# Patient Record
Sex: Female | Born: 1985 | Race: Black or African American | Hispanic: No | Marital: Married | Smoking: Never smoker
Health system: Southern US, Community
[De-identification: ages and names within clinical notes are randomized; demographics above are authoritative.]

## PROBLEM LIST (undated history)

## (undated) ENCOUNTER — Inpatient Hospital Stay (HOSPITAL_COMMUNITY): Payer: Self-pay

## (undated) DIAGNOSIS — J45909 Unspecified asthma, uncomplicated: Secondary | ICD-10-CM

## (undated) HISTORY — PX: WISDOM TOOTH EXTRACTION: SHX21

---

## 2014-10-12 ENCOUNTER — Emergency Department (HOSPITAL_COMMUNITY)
Admission: EM | Admit: 2014-10-12 | Discharge: 2014-10-12 | Disposition: A | Discharge status: Home / Self Care | Attending: Emergency Medicine | Admitting: Emergency Medicine

## 2014-10-12 ENCOUNTER — Encounter (HOSPITAL_COMMUNITY): Payer: Self-pay | Admitting: *Deleted

## 2014-10-12 DIAGNOSIS — J45909 Unspecified asthma, uncomplicated: Secondary | ICD-10-CM | POA: Insufficient documentation

## 2014-10-12 DIAGNOSIS — M5441 Lumbago with sciatica, right side: Secondary | ICD-10-CM | POA: Diagnosis not present

## 2014-10-12 DIAGNOSIS — M5431 Sciatica, right side: Secondary | ICD-10-CM

## 2014-10-12 DIAGNOSIS — M545 Low back pain: Secondary | ICD-10-CM | POA: Diagnosis present

## 2014-10-12 HISTORY — DX: Unspecified asthma, uncomplicated: J45.909

## 2014-10-12 MED ORDER — METHOCARBAMOL 500 MG PO TABS: 500.0000 mg | ORAL_TABLET | Freq: Two times a day (BID) | ORAL | Status: DC

## 2014-10-12 MED ORDER — NAPROXEN 500 MG PO TABS: 500.0000 mg | ORAL_TABLET | Freq: Two times a day (BID) | ORAL | Status: DC
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
## 2014-10-12 NOTE — ED Notes (Signed)
Pt states R hip pain that radiates down R leg.  Also c/o some R lower back pain.  Worse with movement.

## 2014-10-12 NOTE — Discharge Instructions (Signed)
1. Medications: robaxin, naproxyn, usual home medications °2. Treatment: rest, drink plenty of fluids, gentle stretching as discussed, alternate ice and heat °3. Follow Up: Please followup with your primary doctor in 3 days for discussion of your diagnoses and further evaluation after today's visit; if you do not have a primary care doctor use the resource guide provided to find one;  Return to the ER for worsening back pain, difficulty walking, loss of bowel or bladder control or other concerning symptoms ° ° °Back Exercises °Back exercises help treat and prevent back injuries. The goal of back exercises is to increase the strength of your abdominal and back muscles and the flexibility of your back. These exercises should be started when you no longer have back pain. Back exercises include: °· Pelvic Tilt. Lie on your back with your knees bent. Tilt your pelvis until the lower part of your back is against the floor. Hold this position 5 to 10 sec and repeat 5 to 10 times. °· Knee to Chest. Pull first 1 knee up against your chest and hold for 20 to 30 seconds, repeat this with the other knee, and then both knees. This may be done with the other leg straight or bent, whichever feels better. °· Sit-Ups or Curl-Ups. Bend your knees 90 degrees. Start with tilting your pelvis, and do a partial, slow sit-up, lifting your trunk only 30 to 45 degrees off the floor. Take at least 2 to 3 seconds for each sit-up. Do not do sit-ups with your knees out straight. If partial sit-ups are difficult, simply do the above but with only tightening your abdominal muscles and holding it as directed. °· Hip-Lift. Lie on your back with your knees flexed 90 degrees. Push down with your feet and shoulders as you raise your hips a couple inches off the floor; hold for 10 seconds, repeat 5 to 10 times. °· Back arches. Lie on your stomach, propping yourself up on bent elbows. Slowly press on your hands, causing an arch in your low back. Repeat 3  to 5 times. Any initial stiffness and discomfort should lessen with repetition over time. °· Shoulder-Lifts. Lie face down with arms beside your body. Keep hips and torso pressed to floor as you slowly lift your head and shoulders off the floor. °Do not overdo your exercises, especially in the beginning. Exercises may cause you some mild back discomfort which lasts for a few minutes; however, if the pain is more severe, or lasts for more than 15 minutes, do not continue exercises until you see your caregiver. Improvement with exercise therapy for back problems is slow.  °See your caregivers for assistance with developing a proper back exercise program. °Document Released: 03/03/2004 Document Revised: 04/18/2011 Document Reviewed: 11/25/2010 °ExitCare® Patient Information ©2015 [employer]. This information is not intended to replace advice given to you by your health care provider. Make sure you discuss any questions you have with your health care provider. ° ° ° °Emergency Department Resource Guide °1) Find a Doctor and Pay Out of Pocket °Although you won't have to find out who is covered by your insurance plan, it is a good idea to ask around and get recommendations. You will then need to call the office and see if the doctor you have chosen will accept you as a new patient and what types of options they offer for patients who are self-pay. Some doctors offer discounts or will set up payment plans for their patients who do not have insurance, but   you will need to ask so you aren't surprised when you get to your appointment. ° °2) Contact Your Local Health Department °Not all health departments have doctors that can see patients for sick visits, but many do, so it is worth a call to see if yours does. If you don't know where your local health department is, you can check in your phone book. The CDC also has a tool to help you locate your state's health department, and many state websites also have listings of all  of their local health departments. ° °3) Find a Walk-in Clinic °If your illness is not likely to be very severe or complicated, you may want to try a walk in clinic. These are popping up all over the country in pharmacies, drugstores, and shopping centers. They're usually staffed by nurse practitioners or physician assistants that have been trained to treat common illnesses and complaints. They're usually fairly quick and inexpensive. However, if you have serious medical issues or chronic medical problems, these are probably not your best option. ° °No Primary Care Doctor: °- Call [facility] at  832-8000 - they can help you locate a primary care doctor that  accepts your insurance, provides certain services, etc. °- Physician Referral Service- 1-800-533-3463 ° °Chronic Pain Problems: °Organization         Address  Phone   Notes  °[facility]  (336) 297-2271 Patients need to be referred by their primary care doctor.  ° °Medication Assistance: °Organization         Address  Phone   Notes  °[place] [facility] [address] °[place], [place] [address] (336) 641-8030 --Must be a resident of [place] °-- Must have NO insurance coverage whatsoever (no Medicaid/ Medicare, etc.) °-- The pt. MUST have a primary care doctor that directs their care regularly and follows them in the community °  °[facility]  (866) 331-1348   °[employer]  (888) 892-1162   ° °Agencies that provide inexpensive medical care: °Organization         Address  Phone   Notes  °[facility]  (336) 832-8035   °[facility]    (336) 832-7272   °[facility] [address] °[place], [place] [address] (336) 832-4777   °[facility] [address], °[place] (336) 271-4999   °[facility]    (336) 373-0678   °[facility]    (336) 272-1050   °[facility] ° [address],  [place] Phone:  (336) 832-4444, Fax:  (336) 832-4440 Hours of Operation:  9 am - 6 pm, M-F.  Also accepts Medicaid/Medicare and self-pay.  °[facility] ° [address], [place] Phone: (336) 832-3150, Fax: (336) [phone]. Hours of Operation:  8:30 am - 5:30 pm, M-F.  Also accepts Medicaid and self-pay.  °[facility] [place] [address], [place] Phone: (336) 878-6027   °[facility] [address], [place],  (336)723-1848, Ext. 123 Mondays & Thursdays: 7-9 AM.  First 15 patients are seen on a first come, first serve basis. °  ° °Medicaid-accepting [place] Providers: ° °Organization         Address  Phone   Notes  °[facility] [address],  (336) 641-2100 Also accepts self-pay patients.  °[facility] [address], [place] ° (336) 856-9996   °[facility] [address], [place] (336) 288-8857   °[facility] [address] [place] [address], [place] (336) 299-7000   °Veita Bland [address], [place]  ° (336) 373-1557 Only accepts [place] Medicaid patients after they have their name applied to their card.  ° °Self-Pay (no insurance) in [place]: ° °Organization         Address  Phone   Notes  °Sickle Cell Patients, [facility] [address], [place] (336) 832-1970   °[facility] [address], [place] (336) 832-4400   °[facility] ° [address], [place] (336) 992-4800   °[facility]/Dr. Osei-Bonsu ° [address] [place] [address], [place] or [address], [place] (336) [phone] Phone number for both [place] and [place] locations is the same.  °[facility] [address], [place] (336) 299-0000   °[facility] [address] [place] [address], [place] or [address] (336) 852-7530 °(336) 878-2260   °[facility] [address], [place] (336) 350-1642, phone; (336) 294-5005, fax Sees patients 1st and 3rd Saturday of every month.  Must not qualify for public or private insurance (i.e. Medicaid, Medicare, [place] Health Choice, Veterans' Benefits) • Household income should be no more than 200% of the poverty level •The clinic cannot treat you if you are pregnant or think you are pregnant • Sexually transmitted diseases are not treated at the clinic.  ° ° °Dental Care: °Organization         Address  Phone  Notes  °[facility] [address], [place] (336) 641-6152 Accepts children up to age 21 who are enrolled in Medicaid or [place] Health Choice; pregnant women with a Medicaid card; and children who have applied for Medicaid or [place] Health Choice, but were declined, whose parents can pay a reduced fee at time of service.  °[facility] [place]  [address], [place] (336) 641-7733 Accepts children up to age 21 who are enrolled in Medicaid or [place] Health Choice; pregnant women with a Medicaid card; and children who have applied for Medicaid or [place] Health Choice, but were declined, whose parents can pay a reduced fee at time of service.  °[facility] ° [address],  (336) 641-4533 Patients are seen by appointment only. Walk-ins are not accepted. [facility] will see patients 18 years of age and older. °Monday - Tuesday (8am-5pm) °Most Wednesdays (8:30-5pm) °$30 per visit, cash only  °[facility] ° [address], [place] (336) 641-4533 Patients are seen by appointment only. Walk-ins are not accepted. [facility] will see patients 18 years of age and older. °One Wednesday Evening (Monthly: Volunteer Based).  $30 per visit, cash only  °[facility]   (919) 537-3737 for adults; Children under age 4, call Graduate Pediatric Dentistry at ([phone]) [phone]. Children aged 4-14, please call (919) [phone] to request a pediatric application. ° Dental services are provided in all areas of dental care including fillings, crowns and bridges, complete and partial dentures, implants, gum treatment, root canals, and extractions. Preventive care is also provided. Treatment is provided to both adults and children. °Patients are selected via a lottery   and there is often a waiting list. °  °[facility] [address], °[place] ° (336) 763-8833 [email or web address] °  °[facility] [address], [place], [place] (336)723-1848, Ext. 123 Second and Fourth Thursday of each month, opens at 6:30 AM; Clinic ends at 9 AM.  Patients are seen on a first-come first-served basis, and a limited number are seen during each clinic.  ° °[facility] ° [address], [place], [place] (336) 723-7904   Eligibility Requirements °You must have lived in [place], [place], or [place] counties for at least the last three months. °  You cannot be eligible for state or federal sponsored healthcare insurance, including Veterans Administration, Medicaid, or Medicare. °  You generally cannot be eligible for healthcare insurance through your employer.  °  How to apply: °Eligibility screenings are held every Tuesday and Wednesday afternoon from 1:00 pm until 4:00 pm. You do not need an appointment for the interview!  °[facility] [address], [place], [place] 336-631-2330   °[facility]  336-342-8273   °[facility]  336-703-3100   °[facility]  336-570-6415   ° °Behavioral Health Resources in the Community: °Intensive Outpatient Programs °Organization         Address  Phone  Notes  °[facility] [address], [place], [place] 336-878-6098   °[facility] [address], [place], [place] 336-832-9800   °[facility] [address], [place], [place] ° 336-882-2125   °[facility] [address],  °[place], [place] 1-800-853-5163 or 336-641-4981   °Substance Abuse Resources °Organization         Address  Phone  Notes  °[facility]  336-882-2125   °[facility]  336-784-9470   °[facility]  336-285-9073   °[facility]  336-845-3988   °Residential & Outpatient Substance Abuse Program  1-800-659-3381   °Psychological Services °Organization         Address  Phone  Notes  ° Health  336- 832-9600   °[facility]  336- 378-7881   °[facility] [address], [place] 1-800-853-5163 or 336-641-4981   ° °Mobile Crisis Teams °Organization         Address  Phone  Notes  °[facility], Mobile Crisis Care Unit  1-877-626-1772   °Assertive °[facility] ° [address] [place], [place] 336-834-9664   °Sharon DeEsch [address] °[place] 336-554-5454   ° °Self-Help/Support Groups °Organization         Address  Phone             Notes  °[facility] - variety of support groups  336- 373-1402 Call for more information  °Narcotics Anonymous (NA), [facility] [address], °[place]  2 meetings at this location  ° °Residential Treatment Programs °Organization         Address  Phone  Notes  °[facility] [address],    °[place]  1-866-801-8205   °[facility] ° [address], [place], [place] 704-293-8524   °[facility] [address], [place] 336-845-3988 Admissions: 8am-3pm M-F  °[facility] [address]    °[place], [place] 336-841-1104   °[facility] [address], [place],    336-379-7146   °[facility] [address]  °[place], [place] 336-285-9073     °[facility] Intensive Outpatient [address], [place], [place] 336-852-3033   °[facility] [address]  °[place], [place] 1-877-615-2722 or 336-784-9470   °[facility] [address] [place], [place] 336-227-7417 Accepts Medicaid  °[facility] [address]  °[place] 1-800-659-3381 Substance Abuse/Addiction Treatment  ° °[place] Behavioral Health Resources °Organization         Address  Phone  Notes  °[facility]  (888) 581-9988   °Julie Brannon, PhD [address] [place], [place]   (336) 349-5553 or (336) 951-0000   °[facility]   [address] °[place], [place] (336) 349-4454   °[facility] [address], [place], [place] (336) 342-8316 Insurance/Medicaid/sponsorship through [facility]  °[facility] [address]                                    [place], [place] (336) 342-8316 Therapy/tele-psych/case  °[facility] [address]  ° [place], [place] (336) 349-2233    °Dr. Arfeen  (336) 349-4544   °[facility] [place]  [employer] [place] [facility] 1) [address], [place] °2) [address], [place] °3)  [address], [place] (336) 349-3220 °(336) 342-7768 ° °(336) 342-8140   °[place] [facility] (336) 342-1394 or (336) 342-3537 (After Hours)    ° ° ° ° ° °

## 2014-10-12 NOTE — ED Notes (Signed)
Declined W/C at D/C and was escorted to lobby by RN.Declined W/C at D/C and was escorted to lobby by RN. 

## 2014-10-16 ENCOUNTER — Encounter: Payer: Self-pay | Admitting: Emergency Medicine

## 2014-10-16 ENCOUNTER — Emergency Department
Admission: EM | Admit: 2014-10-16 | Discharge: 2014-10-16 | Discharge status: Against Medical Advice | Attending: Emergency Medicine | Admitting: Emergency Medicine

## 2014-10-16 DIAGNOSIS — R102 Pelvic and perineal pain: Secondary | ICD-10-CM | POA: Diagnosis not present

## 2014-10-16 NOTE — ED Notes (Signed)
Patient to ED with c/o bilateral pelvic pain for the last 2-3 days. Reports being very bloated feeling.

## 2014-11-06 ENCOUNTER — Encounter: Payer: Self-pay | Admitting: Obstetrics and Gynecology

## 2014-11-10 ENCOUNTER — Emergency Department (HOSPITAL_COMMUNITY)
Admission: EM | Admit: 2014-11-10 | Discharge: 2014-11-10 | Discharge status: Against Medical Advice | Source: Home / Self Care

## 2014-11-10 ENCOUNTER — Inpatient Hospital Stay (HOSPITAL_COMMUNITY)
Admission: AD | Admit: 2014-11-10 | Discharge: 2014-11-11 | Disposition: A | Discharge status: Home / Self Care | Source: Ambulatory Visit | Attending: Family Medicine | Admitting: Family Medicine

## 2014-11-10 ENCOUNTER — Encounter (HOSPITAL_COMMUNITY): Payer: Self-pay | Admitting: *Deleted

## 2014-11-10 DIAGNOSIS — J45909 Unspecified asthma, uncomplicated: Secondary | ICD-10-CM | POA: Diagnosis not present

## 2014-11-10 DIAGNOSIS — Z3A01 Less than 8 weeks gestation of pregnancy: Secondary | ICD-10-CM | POA: Diagnosis not present

## 2014-11-10 DIAGNOSIS — R102 Pelvic and perineal pain: Secondary | ICD-10-CM | POA: Insufficient documentation

## 2014-11-10 DIAGNOSIS — O26891 Other specified pregnancy related conditions, first trimester: Secondary | ICD-10-CM | POA: Diagnosis present

## 2014-11-10 LAB — CBC
HEMATOCRIT: 35.6 % — AB (ref 36.0–46.0)
HEMOGLOBIN: 12.2 g/dL (ref 12.0–15.0)
MCH: 29.5 pg (ref 26.0–34.0)
MCHC: 34.3 g/dL (ref 30.0–36.0)
MCV: 86 fL (ref 78.0–100.0)
Platelets: 242 10*3/uL (ref 150–400)
RBC: 4.14 MIL/uL (ref 3.87–5.11)
RDW: 14.5 % (ref 11.5–15.5)
WBC: 10.9 10*3/uL — ABNORMAL HIGH (ref 4.0–10.5)

## 2014-11-10 LAB — URINALYSIS, ROUTINE W REFLEX MICROSCOPIC
BILIRUBIN URINE: NEGATIVE
Glucose, UA: NEGATIVE mg/dL
HGB URINE DIPSTICK: NEGATIVE
KETONES UR: NEGATIVE mg/dL
Leukocytes, UA: NEGATIVE
NITRITE: NEGATIVE
Protein, ur: NEGATIVE mg/dL
SPECIFIC GRAVITY, URINE: 1.01 (ref 1.005–1.030)
UROBILINOGEN UA: 0.2 mg/dL (ref 0.0–1.0)
pH: 7 (ref 5.0–8.0)

## 2014-11-10 LAB — POCT PREGNANCY, URINE: PREG TEST UR: POSITIVE — AB

## 2014-11-10 LAB — HCG, QUANTITATIVE, PREGNANCY: HCG, BETA CHAIN, QUANT, S: 140446 m[IU]/mL — AB (ref <5)

## 2014-11-10 NOTE — MAU Note (Signed)
Pt reports really bad lower abd cramping x 2 days, lower abd pressure today. Denies bleeding.

## 2014-11-10 NOTE — ED Notes (Signed)
Pt stated that she didn't want to wait and that she was going to leave.

## 2014-11-10 NOTE — MAU Provider Note (Signed)
History     CSN: [id number]  Arrival date and time: 11/10/14 0865   First Provider Initiated Contact with Patient 11/10/14 2344      No chief complaint on file.  HPI Comments: Veronica Wilkerson is a 29 y.o. G3P0 at Unknown who presents today with abdominal pain and pelvic pressure. She states that her LMP was 09/19/14.   Pelvic Pain The patient's primary symptoms include pelvic pain. This is a new problem. The current episode started yesterday. The problem occurs constantly. The problem has been unchanged. Pain severity now: 5/10  The problem affects both sides. She is pregnant. Associated symptoms include abdominal pain and nausea. Pertinent negatives include no constipation, diarrhea, dysuria, fever, frequency, urgency or vomiting. The vaginal discharge was normal. There has been no bleeding. Nothing aggravates the symptoms. She has tried nothing for the symptoms.    Past Medical History  Diagnosis Date  . Asthma     Past Surgical History  Procedure Laterality Date  . Cesarean section      History reviewed. No pertinent family history.  Social History  Substance Use Topics  . Smoking status: Never Smoker   . Smokeless tobacco: None  . Alcohol Use: Yes     Comment: occ    Allergies: No Known Allergies  Prescriptions prior to admission  Medication Sig Dispense Refill Last Dose  . albuterol (ACCUNEB) 0.63 MG/3ML nebulizer solution Take 1 ampule by nebulization every 6 (six) hours as needed for wheezing.   Past Month at Unknown time  . methocarbamol (ROBAXIN) 500 MG tablet Take 1 tablet (500 mg total) by mouth 2 (two) times daily. 20 tablet 0   . montelukast (SINGULAIR) 10 MG tablet Take 10 mg by mouth at bedtime.   More than a month at Unknown time  . naproxen (NAPROSYN) 500 MG tablet Take 1 tablet (500 mg total) by mouth 2 (two) times daily with a meal. 30 tablet 0     Review of Systems  Constitutional: Negative for fever.  Gastrointestinal: Positive for nausea and  abdominal pain. Negative for vomiting, diarrhea and constipation.  Genitourinary: Positive for pelvic pain. Negative for dysuria, urgency and frequency.   Physical Exam   Blood pressure 117/60, pulse 68, temperature 98.9 F (37.2 C), temperature source Oral, resp. rate 18, height  (1.676 m), weight 108.863 kg (240 lb), last menstrual period 09/19/2014, SpO2 99 %.  Physical Exam  Nursing note and vitals reviewed. Constitutional: She is oriented to person, place, and time. She appears well-developed and well-nourished. No distress.  HENT:  Head: Normocephalic.  Eyes: EOM are normal.  Cardiovascular: Normal rate.   Respiratory: Effort normal.  GI: Soft.  Musculoskeletal: Normal range of motion.  Neurological: She is alert and oriented to person, place, and time.  Skin: Skin is warm and dry.  Psychiatric: She has a normal mood and affect.   Results for orders placed or performed during the hospital encounter of 11/10/14 (from the past 24 hour(s))  Urinalysis, Routine w reflex microscopic (not at [facility])     Status: None   Collection Time: 11/10/14  7:45 PM  Result Value Ref Range   Color, Urine YELLOW YELLOW   APPearance CLEAR CLEAR   Specific Gravity, Urine 1.010 1.005 - 1.030   pH 7.0 5.0 - 8.0   Glucose, UA NEGATIVE NEGATIVE mg/dL   Hgb urine dipstick NEGATIVE NEGATIVE   Bilirubin Urine NEGATIVE NEGATIVE   Ketones, ur NEGATIVE NEGATIVE mg/dL   Protein, ur NEGATIVE NEGATIVE mg/dL  Urobilinogen, UA 0.2 0.0 - 1.0 mg/dL   Nitrite NEGATIVE NEGATIVE   Leukocytes, UA NEGATIVE NEGATIVE  Pregnancy, urine POC     Status: Abnormal   Collection Time: 11/10/14  8:02 PM  Result Value Ref Range   Preg Test, Ur POSITIVE (A) NEGATIVE  hCG, quantitative, pregnancy     Status: Abnormal   Collection Time: 11/10/14  8:20 PM  Result Value Ref Range   hCG, Beta Chain, Quant, S 140446 (H) <5 mIU/mL  CBC     Status: Abnormal   Collection Time: 11/10/14  8:20 PM  Result Value Ref Range    WBC 10.9 (H) 4.0 - 10.5 K/uL   RBC 4.14 3.87 - 5.11 MIL/uL   Hemoglobin 12.2 12.0 - 15.0 g/dL   HCT 96.0 (L) 45.4 - 09.8 %   MCV 86.0 78.0 - 100.0 fL   MCH 29.5 26.0 - 34.0 pg   MCHC 34.3 30.0 - 36.0 g/dL   RDW 11.9 14.7 - 82.9 %   Platelets 242 150 - 400 K/uL  ABO/Rh     Status: None (Preliminary result)   Collection Time: 11/10/14  8:20 PM  Result Value Ref Range   ABO/RH(D) O POS   Wet prep, genital     Status: Abnormal   Collection Time: 11/10/14 11:45 PM  Result Value Ref Range   Yeast Wet Prep HPF POC NONE SEEN NONE SEEN   Trich, Wet Prep NONE SEEN NONE SEEN   Clue Cells Wet Prep HPF POC NONE SEEN NONE SEEN   WBC, Wet Prep HPF POC FEW (A) NONE SEEN   US Ob Comp Less 14 Wks  11/11/2014   CLINICAL DATA:  Pelvic pain  EXAM: OBSTETRIC <14 WK [place] AND TRANSVAGINAL OB US  TECHNIQUE: Both transabdominal and transvaginal ultrasound examinations were performed for complete evaluation of the gestation as well as the maternal uterus, adnexal regions, and pelvic cul-de-sac. Transvaginal technique was performed to assess early pregnancy.  COMPARISON:  None.  FINDINGS: There are 2 intrauterine sacs. One is a viable intrauterine gestation with visible yolk sac and embryo. The other is an empty sac. The empty sac measures 14.6 mm in mean diameter which would be consistent with a gestational age of [redacted] weeks 2 days. The viable gestational sac measures 26.1 mm and the visible embryo has a crown-rump length of 14.1 mm, consistent with a gestational age of [redacted] weeks 5 days. Heart rate is 150 beats per min. [place] EDC = 06/25/15.  Maternal uterus/adnexae: Unremarkable ovaries. Small-moderate subchorionic hemorrhage measuring 3.0 x 2.7 x 1.8 cm.  IMPRESSION: 1. There is a viable intrauterine gestational sac measuring 7 weeks 5 days by crown-rump length. 2. There is a second intrauterine sac which is empty, with mean sac diameter consistent with 6 week 2 day gestation. No yolk sac or fetal parts are visible in this second  sac. 3. Small to moderate subchorionic hemorrhage.   Electronically Signed   By: Ellery Plunk M.D.   On: 11/11/2014 01:43   US Ob Transvaginal  11/11/2014   CLINICAL DATA:  Pelvic pain  EXAM: OBSTETRIC <14 WK [place] AND TRANSVAGINAL OB US  TECHNIQUE: Both transabdominal and transvaginal ultrasound examinations were performed for complete evaluation of the gestation as well as the maternal uterus, adnexal regions, and pelvic cul-de-sac. Transvaginal technique was performed to assess early pregnancy.  COMPARISON:  None.  FINDINGS: There are 2 intrauterine sacs. One is a viable intrauterine gestation with visible yolk sac and embryo. The other is an  empty sac. The empty sac measures 14.6 mm in mean diameter which would be consistent with a gestational age of [redacted] weeks 2 days. The viable gestational sac measures 26.1 mm and the visible embryo has a crown-rump length of 14.1 mm, consistent with a gestational age of [redacted] weeks 5 days. Heart rate is 150 beats per min. [place] EDC = 06/25/15.  Maternal uterus/adnexae: Unremarkable ovaries. Small-moderate subchorionic hemorrhage measuring 3.0 x 2.7 x 1.8 cm.  IMPRESSION: 1. There is a viable intrauterine gestational sac measuring 7 weeks 5 days by crown-rump length. 2. There is a second intrauterine sac which is empty, with mean sac diameter consistent with 6 week 2 day gestation. No yolk sac or fetal parts are visible in this second sac. 3. Small to moderate subchorionic hemorrhage.   Electronically Signed   By: Ellery Plunk M.D.   On: 11/11/2014 01:43    MAU Course  Procedures  MDM   Assessment and Plan   1. Pelvic pain affecting pregnancy in first trimester, antepartum    DC home Comfort measures reviewed  1st Trimester precautions  Bleeding precautions RX: phenergan PRN #30  Return to MAU as needed [facility] as soon as possible.   Follow-up Information    Schedule an appointment as soon as possible for a visit with [facility] [place].    Contact information:   [address] [facility] [place] [address] [phone]      Tawnya Crook 11/11/2014, 1:50 AM

## 2014-11-11 ENCOUNTER — Inpatient Hospital Stay (HOSPITAL_COMMUNITY)

## 2014-11-11 DIAGNOSIS — O26891 Other specified pregnancy related conditions, first trimester: Secondary | ICD-10-CM

## 2014-11-11 LAB — WET PREP, GENITAL
CLUE CELLS WET PREP: NONE SEEN
Trich, Wet Prep: NONE SEEN
Yeast Wet Prep HPF POC: NONE SEEN

## 2014-11-11 LAB — HIV ANTIBODY (ROUTINE TESTING W REFLEX): HIV SCREEN 4TH GENERATION: NONREACTIVE

## 2014-11-11 LAB — ABO/RH: ABO/RH(D): O POS

## 2014-11-11 LAB — RPR: RPR: NONREACTIVE

## 2014-11-11 LAB — GC/CHLAMYDIA PROBE AMP (~~LOC~~) NOT AT ARMC
CHLAMYDIA, DNA PROBE: NEGATIVE
NEISSERIA GONORRHEA: NEGATIVE

## 2014-11-11 MED ORDER — PROMETHAZINE HCL 25 MG PO TABS: 12.5000 mg | ORAL_TABLET | Freq: Four times a day (QID) | ORAL | Status: DC | PRN

## 2014-11-11 NOTE — Discharge Instructions (Signed)
Prenatal Care Providers °[facility]    [facility] ° Phone- [phone]     Phone: [phone] °         °[facility]                      [facility] ° @[place]     Phone: [phone] ° Phone: [phone] °        [facility] °[facility]     Phone: [phone] ° Phone: [phone]   °        [facility] °[facility] [place]                hone: [phone] ° Phone: [phone] °        [facility] °Dr. Bernard Marshall      Phone: [phone] ° Phone: [phone] °        [facility] °[facility]                Phone: [phone] ° Women’s Health  ° Phone:[phone]    [facility] °         Phone: [phone] °[facility] °  Phone: [phone] °Safe Medications in Pregnancy  ° °Acne: °Benzoyl Peroxide °Salicylic Acid ° °Backache/Headache: °Tylenol: 2 regular strength every 4 hours OR °             2 Extra strength every 6 hours ° °Colds/Coughs/Allergies: °Benadryl (alcohol free) 25 mg every 6 hours as needed °Breath right strips °Claritin °Cepacol throat lozenges °Chloraseptic throat spray °Cold-Eeze- up to three times per day °Cough drops, alcohol free °Flonase (by prescription only) °Guaifenesin °Mucinex °Robitussin DM (plain only, alcohol free) °Saline nasal spray/drops °Sudafed (pseudoephedrine) & Actifed ** use only after [redacted] weeks gestation and if you do not have high blood pressure °Tylenol °Vicks Vaporub °Zinc lozenges °Zyrtec  ° °Constipation: °Colace °Ducolax suppositories °Fleet enema °Glycerin suppositories °Metamucil °Milk of magnesia °Miralax °Senokot °Smooth move tea ° °Diarrhea: °Kaopectate °Imodium A-D ° °*NO pepto Bismol ° °Hemorrhoids: °Anusol °Anusol HC °Preparation H °Tucks ° °Indigestion: °Tums °Maalox °Mylanta °Zantac  °Pepcid ° °Insomnia: °Benadryl (alcohol free) 25mg every 6 hours as needed °Tylenol  PM °Unisom, no Gelcaps ° °Leg Cramps: °Tums °MagGel ° °Nausea/Vomiting:  °Bonine °Dramamine °Emetrol °Ginger extract °Sea bands °Meclizine  °Nausea medication to take during pregnancy:  °Unisom (doxylamine succinate 25 mg tablets) Take one tablet daily at bedtime. If symptoms are not adequately controlled, the dose can be increased to a maximum recommended dose of two tablets daily (1/2 tablet in the morning, 1/2 tablet mid-afternoon and one at bedtime). °Vitamin B6 100mg tablets. Take one tablet twice a day (up to 200 mg per day). ° °Skin Rashes: °Aveeno products °Benadryl cream or 25mg every 6 hours as needed °Calamine Lotion °1% cortisone cream ° °Yeast infection: °Gyne-lotrimin 7 °Monistat 7 ° ° °**If taking multiple medications, please check labels to avoid duplicating the same active ingredients °**take medication as directed on the label °** Do not exceed 4000 mg of tylenol in 24 hours °**Do not take medications that contain aspirin or ibuprofen ° ° ° ° °

## 2014-11-17 ENCOUNTER — Ambulatory Visit (INDEPENDENT_AMBULATORY_CARE_PROVIDER_SITE_OTHER): Admitting: Obstetrics

## 2014-11-17 ENCOUNTER — Encounter: Payer: Self-pay | Admitting: Obstetrics

## 2014-11-17 VITALS — BP 120/72 | HR 80

## 2014-11-17 DIAGNOSIS — Z3481 Encounter for supervision of other normal pregnancy, first trimester: Secondary | ICD-10-CM

## 2014-11-17 LAB — POCT URINALYSIS DIPSTICK
Bilirubin, UA: NEGATIVE
Blood, UA: NEGATIVE
GLUCOSE UA: NEGATIVE
Ketones, UA: NEGATIVE
LEUKOCYTES UA: NEGATIVE
NITRITE UA: NEGATIVE
PROTEIN UA: NEGATIVE
SPEC GRAV UA: 1.01
UROBILINOGEN UA: NEGATIVE
pH, UA: 7

## 2014-11-17 NOTE — Progress Notes (Signed)
Subjective:    Veronica Wilkerson is being seen today for her first obstetrical visit.  This is a planned pregnancy. She is at [redacted]w[redacted]d gestation. Her obstetrical history is significant for none. Relationship with FOB: spouse, living together. Patient does intend to breast feed. Pregnancy history fully reviewed.  The information documented in the HPI was reviewed and verified.  Menstrual History: OB History    Gravida Para Term Preterm AB TAB SAB Ectopic Multiple Living   Patient's last menstrual period was 09/19/2014.    Past Medical History  Diagnosis Date  . Asthma     Past Surgical History  Procedure Laterality Date  . Cesarean section       (Not in a hospital admission) No Known Allergies  Social History  Substance Use Topics  . Smoking status: Never Smoker   . Smokeless tobacco: Not on file  . Alcohol Use: 0.0 oz/week    0 Standard drinks or equivalent per week     Comment: occ, not currently     Family History  Problem Relation Age of Onset  . Diabetes Mother   . Hypertension Mother      Review of Systems Constitutional: negative for weight loss Gastrointestinal: negative for vomiting Genitourinary:negative for genital lesions and vaginal discharge and dysuria Musculoskeletal:negative for back pain Behavioral/Psych: negative for abusive relationship, depression, illegal drug usage and tobacco use    Objective:    BP 120/72 mmHg  Pulse 80  LMP 09/19/2014 General Appearance:    Alert, cooperative, no distress, appears stated age  Head:    Normocephalic, without obvious abnormality, atraumatic  Eyes:    PERRL, conjunctiva/corneas clear, EOM's intact, fundi    benign, both eyes  Ears:    Normal TM's and external ear canals, both ears  Nose:   Nares normal, septum midline, mucosa normal, no drainage    or sinus tenderness  Throat:   Lips, mucosa, and tongue normal; teeth and gums normal  Neck:   Supple, symmetrical, trachea midline, no  adenopathy;    thyroid:  no enlargement/tenderness/nodules; no carotid   bruit or JVD  Back:     Symmetric, no curvature, ROM normal, no CVA tenderness  Lungs:     Clear to auscultation bilaterally, respirations unlabored  Chest Wall:    No tenderness or deformity   Heart:    Regular rate and rhythm, S1 and S2 normal, no murmur, rub   or gallop  Breast Exam:    No tenderness, masses, or nipple abnormality  Abdomen:     Soft, non-tender, bowel sounds active all four quadrants,    no masses, no organomegaly  Genitalia:    Normal female without lesion, discharge or tenderness  Extremities:   Extremities normal, atraumatic, no cyanosis or edema  Pulses:   2+ and symmetric all extremities  Skin:   Skin color, texture, turgor normal, no rashes or lesions  Lymph nodes:   Cervical, supraclavicular, and axillary nodes normal  Neurologic:   CNII-XII intact, normal strength, sensation and reflexes    throughout      Lab Review Urine pregnancy test Labs reviewed yes Radiologic studies reviewed yes Assessment:    Pregnancy at [redacted]w[redacted]d weeks    Plan:      Prenatal vitamins.  Counseling provided regarding continued use of seat belts, cessation of alcohol consumption, smoking or use of illicit drugs; infection precautions i.e., influenza/TDAP immunizations, toxoplasmosis,CMV, parvovirus, listeria and varicella; workplace  safety, exercise during pregnancy; routine dental care, safe medications, sexual activity, hot tubs, saunas, pools, travel, caffeine use, fish and methlymercury, potential toxins, hair treatments, varicose veins Weight gain recommendations per IOM guidelines reviewed: underweight/BMI< 18.5--> gain 28 - 40 lbs; normal weight/BMI 18.5 - 24.9--> gain 25 - 35 lbs; overweight/BMI 25 - 29.9--> gain 15 - 25 lbs; obese/BMI >30->gain  11 - 20 lbs Problem list reviewed and updated. FIRST/CF mutation testing/NIPT/QUAD SCREEN/fragile X/Ashkenazi Jewish population testing/Spinal muscular atrophy  discussed: requested. Role of ultrasound in pregnancy discussed; fetal survey: requested. Amniocentesis discussed: not indicated. VBAC calculator score: VBAC consent form provided Meds ordered this encounter  Medications  . prenatal vitamin w/FE, FA (PRENATAL 1 + 1) 27-1 MG TABS tablet    Sig: Take 1 tablet by mouth daily at 12 noon.   Orders Placed This Encounter  Procedures  . Culture, OB Urine  . SureSwab, Vaginosis/Vaginitis Plus  . Obstetric panel  . HIV antibody  . Hemoglobinopathy evaluation  . Varicella zoster antibody, IgG  . Vit D  25 hydroxy (rtn osteoporosis monitoring)  . TSH  . POCT urinalysis dipstick    Follow up in 4 weeks.

## 2014-11-18 LAB — HIV ANTIBODY (ROUTINE TESTING W REFLEX): HIV: NONREACTIVE

## 2014-11-18 LAB — OBSTETRIC PANEL
ANTIBODY SCREEN: NEGATIVE
BASOS PCT: 0 % (ref 0–1)
Basophils Absolute: 0 10*3/uL (ref 0.0–0.1)
EOS ABS: 0.2 10*3/uL (ref 0.0–0.7)
EOS PCT: 2 % (ref 0–5)
HEMATOCRIT: 38.9 % (ref 36.0–46.0)
HEMOGLOBIN: 13.2 g/dL (ref 12.0–15.0)
Hepatitis B Surface Ag: NEGATIVE
LYMPHS ABS: 1.6 10*3/uL (ref 0.7–4.0)
Lymphocytes Relative: 16 % (ref 12–46)
MCH: 29.7 pg (ref 26.0–34.0)
MCHC: 33.9 g/dL (ref 30.0–36.0)
MCV: 87.4 fL (ref 78.0–100.0)
MONO ABS: 0.4 10*3/uL (ref 0.1–1.0)
MONOS PCT: 4 % (ref 3–12)
MPV: 11.2 fL (ref 8.6–12.4)
NEUTROS PCT: 78 % — AB (ref 43–77)
Neutro Abs: 7.7 10*3/uL (ref 1.7–7.7)
Platelets: 287 10*3/uL (ref 150–400)
RBC: 4.45 MIL/uL (ref 3.87–5.11)
RDW: 14.9 % (ref 11.5–15.5)
RH TYPE: POSITIVE
RUBELLA: 1.45 {index} — AB (ref <0.90)
WBC: 9.9 10*3/uL (ref 4.0–10.5)

## 2014-11-18 LAB — VARICELLA ZOSTER ANTIBODY, IGG: Varicella IgG: 1386 Index — ABNORMAL HIGH (ref <135.00)

## 2014-11-18 LAB — CULTURE, OB URINE

## 2014-11-18 LAB — VITAMIN D 25 HYDROXY (VIT D DEFICIENCY, FRACTURES): Vit D, 25-Hydroxy: 33 ng/mL (ref 30–100)

## 2014-11-18 LAB — TSH: TSH: 2.257 u[IU]/mL (ref 0.350–4.500)

## 2014-11-19 LAB — HEMOGLOBINOPATHY EVALUATION
HEMOGLOBIN OTHER: 0 %
HGB A2 QUANT: 2.6 % (ref 2.2–3.2)
HGB A: 93.3 % — AB (ref 96.8–97.8)
HGB S QUANTITAION: 0 %
Hgb F Quant: 4.1 % — ABNORMAL HIGH (ref 0.0–2.0)

## 2014-11-19 LAB — PAP, TP IMAGING W/ HPV RNA, RFLX HPV TYPE 16,18/45: HPV mRNA, High Risk: NOT DETECTED

## 2014-11-21 LAB — SURESWAB, VAGINOSIS/VAGINITIS PLUS
ATOPOBIUM VAGINAE: NOT DETECTED Log (cells/mL)
C. GLABRATA, DNA: NOT DETECTED
C. PARAPSILOSIS, DNA: NOT DETECTED
C. albicans, DNA: DETECTED — AB
C. trachomatis RNA, TMA: NOT DETECTED
C. tropicalis, DNA: NOT DETECTED
Gardnerella vaginalis: <4.7 Log (cells/mL)
LACTOBACILLUS SPECIES: 7.3 Log (cells/mL)
MEGASPHAERA SPECIES: NOT DETECTED Log (cells/mL)
N. GONORRHOEAE RNA, TMA: NOT DETECTED
T. vaginalis RNA, QL TMA: NOT DETECTED

## 2014-11-22 ENCOUNTER — Other Ambulatory Visit: Payer: Self-pay | Admitting: Obstetrics

## 2014-11-22 DIAGNOSIS — B3731 Acute candidiasis of vulva and vagina: Secondary | ICD-10-CM

## 2014-11-22 DIAGNOSIS — B373 Candidiasis of vulva and vagina: Secondary | ICD-10-CM

## 2014-11-22 MED ORDER — TERCONAZOLE 0.4 % VA CREA: 1.0000 | TOPICAL_CREAM | Freq: Every day | VAGINAL | Status: DC

## 2014-12-05 ENCOUNTER — Encounter (HOSPITAL_COMMUNITY): Payer: Self-pay | Admitting: *Deleted

## 2014-12-05 ENCOUNTER — Inpatient Hospital Stay (HOSPITAL_COMMUNITY)
Admission: AD | Admit: 2014-12-05 | Discharge: 2014-12-05 | Disposition: A | Discharge status: Home / Self Care | Source: Ambulatory Visit | Attending: Obstetrics | Admitting: Obstetrics

## 2014-12-05 DIAGNOSIS — O26891 Other specified pregnancy related conditions, first trimester: Secondary | ICD-10-CM | POA: Diagnosis not present

## 2014-12-05 DIAGNOSIS — N949 Unspecified condition associated with female genital organs and menstrual cycle: Secondary | ICD-10-CM

## 2014-12-05 DIAGNOSIS — Z3A11 11 weeks gestation of pregnancy: Secondary | ICD-10-CM | POA: Insufficient documentation

## 2014-12-05 DIAGNOSIS — B3731 Acute candidiasis of vulva and vagina: Secondary | ICD-10-CM

## 2014-12-05 DIAGNOSIS — B379 Candidiasis, unspecified: Secondary | ICD-10-CM | POA: Insufficient documentation

## 2014-12-05 DIAGNOSIS — R102 Pelvic and perineal pain: Secondary | ICD-10-CM | POA: Diagnosis present

## 2014-12-05 DIAGNOSIS — O98811 Other maternal infectious and parasitic diseases complicating pregnancy, first trimester: Secondary | ICD-10-CM | POA: Insufficient documentation

## 2014-12-05 DIAGNOSIS — B373 Candidiasis of vulva and vagina: Secondary | ICD-10-CM

## 2014-12-05 LAB — URINALYSIS, ROUTINE W REFLEX MICROSCOPIC
BILIRUBIN URINE: NEGATIVE
Glucose, UA: NEGATIVE mg/dL
Hgb urine dipstick: NEGATIVE
KETONES UR: NEGATIVE mg/dL
LEUKOCYTES UA: NEGATIVE
NITRITE: NEGATIVE
PH: 7 (ref 5.0–8.0)
PROTEIN: NEGATIVE mg/dL
Specific Gravity, Urine: 1.015 (ref 1.005–1.030)
Urobilinogen, UA: 0.2 mg/dL (ref 0.0–1.0)

## 2014-12-05 NOTE — Discharge Instructions (Signed)
Monilial Vaginitis Vaginitis in a soreness, swelling and redness (inflammation) of the vagina and vulva. Monilial vaginitis is not a sexually transmitted infection. CAUSES  Yeast vaginitis is caused by yeast (candida) that is normally found in your vagina. With a yeast infection, the candida has overgrown in number to a point that upsets the chemical balance. SYMPTOMS   White, thick vaginal discharge.  Swelling, itching, redness and irritation of the vagina and possibly the lips of the vagina (vulva).  Burning or painful urination.  Painful intercourse. DIAGNOSIS  Things that may contribute to monilial vaginitis are:  Postmenopausal and virginal states.  Pregnancy.  Infections.  Being tired, sick or stressed, especially if you had monilial vaginitis in the past.  Diabetes. Good control will help lower the chance.  Birth control pills.  Tight fitting garments.  Using bubble bath, feminine sprays, douches or deodorant tampons.  Taking certain medications that kill germs (antibiotics).  Sporadic recurrence can occur if you become ill. TREATMENT  Your caregiver will give you medication.  There are several kinds of anti monilial vaginal creams and suppositories specific for monilial vaginitis. For recurrent yeast infections, use a suppository or cream in the vagina 2 times a week, or as directed.  Anti-monilial or steroid cream for the itching or irritation of the vulva may also be used. Get your caregiver's permission.  Painting the vagina with methylene blue solution may help if the monilial cream does not work.  Eating yogurt may help prevent monilial vaginitis. HOME CARE INSTRUCTIONS   Finish all medication as prescribed.  Do not have sex until treatment is completed or after your caregiver tells you it is okay.  Take warm sitz baths.  Do not douche.  Do not use tampons, especially scented ones.  Wear cotton underwear.  Avoid tight pants and panty  hose.  Tell your sexual partner that you have a yeast infection. They should go to their caregiver if they have symptoms such as mild rash or itching.  Your sexual partner should be treated as well if your infection is difficult to eliminate.  Practice safer sex. Use condoms.  Some vaginal medications cause latex condoms to fail. Vaginal medications that harm condoms are:  Cleocin cream.  Butoconazole (Femstat).  Terconazole (Terazol) vaginal suppository.  Miconazole (Monistat) (may be purchased over the counter). SEEK MEDICAL CARE IF:   You have a temperature by mouth above 102 F (38.9 C).  The infection is getting worse after 2 days of treatment.  The infection is not getting better after 3 days of treatment.  You develop blisters in or around your vagina.  You develop vaginal bleeding, and it is not your menstrual period.  You have pain when you urinate.  You develop intestinal problems.  You have pain with sexual intercourse.   This information is not intended to replace advice given to you by your health care provider. Make sure you discuss any questions you have with your health care provider.   Document Released: 11/03/2004 Document Revised: 04/18/2011 Document Reviewed: 07/28/2014 Elsevier Interactive Patient Education 2016 Elsevier Inc. Cutaneous Candidiasis Cutaneous candidiasis is a condition in which there is an overgrowth of yeast (candida) on the skin. Yeast normally live on the skin, but in small enough numbers not to cause any symptoms. In certain cases, increased growth of the yeast may cause an actual yeast infection. This kind of infection usually occurs in areas of the skin that are constantly warm and moist, such as the armpits or the groin. Yeast  is the most common cause of diaper rash in babies and in people who cannot control their bowel movements (incontinence). CAUSES  The fungus that most often causes cutaneous candidiasis is Candida  albicans. Conditions that can increase the risk of getting a yeast infection of the skin include:  Obesity.  Pregnancy.  Diabetes.  Taking antibiotic medicine.  Taking birth control pills.  Taking steroid medicines.  Thyroid disease.  An iron or zinc deficiency.  Problems with the immune system. SYMPTOMS   Red, swollen area of the skin.  Bumps on the skin.  Itchiness. DIAGNOSIS  The diagnosis of cutaneous candidiasis is usually based on its appearance. Light scrapings of the skin may also be taken and viewed under a microscope to identify the presence of yeast. TREATMENT  Antifungal creams may be applied to the infected skin. In severe cases, oral medicines may be needed.  HOME CARE INSTRUCTIONS   Keep your skin clean and dry.  Maintain a healthy weight.  If you have diabetes, keep your blood sugar under control. SEEK IMMEDIATE MEDICAL CARE IF:  Your rash continues to spread despite treatment.  You have a fever, chills, or abdominal pain.   This information is not intended to replace advice given to you by your health care provider. Make sure you discuss any questions you have with your health care provider.   Document Released: 10/12/2010 Document Revised: 04/18/2011 Document Reviewed: 07/28/2014 Elsevier Interactive Patient Education [employer] Elsevier Inc.

## 2014-12-05 NOTE — MAU Note (Signed)
Pt has hx of prolapsed uterus, feels like it is bulging out when she voids.  Having pelvic pressure, called MD office, was advised to come to MAU.  Denies bleeding.

## 2014-12-05 NOTE — MAU Provider Note (Signed)
History     CSN: [id number]  Arrival date and time: 12/05/14 1601   None     Chief Complaint  Patient presents with  . pelvic pressure    HPI Veronica Wilkerson is 29 y.o. G3P0003 (1 set of fraternal twins) 3353w1d weeks presenting with pelvic pressure, noticed when she went to backroom   Felt a bulge when she voided. Is concerned about her uterus.  She is a patient of Dr. Verdell CarmineHarper's.  Called office yesterday and today and was instructed to come to MAU for evaluation.  Hx of a " little uterine prolapse" per patient report, told by [place]YN in [place].  See was seen in MAU 10/4 for the same sxs.  At that time had + UPT, U/S showed viable IUP 2881w5d and a second sac 253w2d without YS or fetal parts.  [facility].  Dx with yeast infection 2 weeks ago, has Rx at pharmacy she hasn't picked up.    Past Medical History  Diagnosis Date  . Asthma     Past Surgical History  Procedure Laterality Date  . Cesarean section      Family History  Problem Relation Age of Onset  . Diabetes Mother   . Hypertension Mother     Social History  Substance Use Topics  . Smoking status: Never Smoker   . Smokeless tobacco: None  . Alcohol Use: 0.0 oz/week    0 Standard drinks or equivalent per week     Comment: occ, not currently     Allergies: No Known Allergies  Prescriptions prior to admission  Medication Sig Dispense Refill Last Dose  . Doxylamine-Pyridoxine (DICLEGIS) 10-10 MG TBEC Take 1 tablet by mouth at bedtime.   12/04/2014 at Unknown time  . nystatin-triamcinolone ointment (MYCOLOG) Apply 1 application topically at bedtime.   12/04/2014 at Unknown time  . prenatal vitamin w/FE, FA (PRENATAL 1 + 1) 27-1 MG TABS tablet Take 1 tablet by mouth daily at 12 noon.   Past Month at Unknown time  . albuterol (ACCUNEB) 0.63 MG/3ML nebulizer solution Take 1 ampule by nebulization every 6 (six) hours as needed for wheezing.   rescue  . promethazine (PHENERGAN) 25 MG tablet Take 0.5-1 tablets (12.5-25 mg  total) by mouth every 6 (six) hours as needed. (Patient not taking: Reported on 12/05/2014) 30 tablet 0 Not Taking at Unknown time  . terconazole (TERAZOL 7) 0.4 % vaginal cream Place 1 applicator vaginally at bedtime. (Patient not taking: Reported on 12/05/2014) 45 g 0     Review of Systems  Constitutional: Negative for fever and chills.  Gastrointestinal: Positive for nausea and vomiting (occasional 2 X week).  Genitourinary: Positive for frequency. Negative for dysuria and urgency.       Neg for vaginal bleeding.    Physical Exam   Blood pressure 124/70, pulse 86, temperature 98.4 F (36.9 C), temperature source Oral, resp. rate 18, last menstrual period 09/19/2014.  Physical Exam  Constitutional: She is oriented to person, place, and time. She appears well-developed and well-nourished.  HENT:  Head: Normocephalic.  Neck: Normal range of motion. Neck supple.  Cardiovascular: Normal rate.   Respiratory: Effort normal. No respiratory distress.  GI: Soft. There is no tenderness.  Genitourinary: There is no rash, tenderness or lesion on the right labia. There is no rash, tenderness or lesion on the left labia. Uterus is enlarged (measures11 week size.  Uterus is retroverted.  Neg for prolapse). Uterus is not tender. Cervix exhibits no motion tenderness, no discharge  and no friability. Right adnexum displays no mass, no tenderness and no fullness. Left adnexum displays no mass, no tenderness and no fullness. No erythema, tenderness or bleeding in the vagina. Vaginal discharge (small amount of white clumpy discharge) found.  Musculoskeletal: Normal range of motion. She exhibits no edema.  Neurological: She is alert and oriented to person, place, and time.  Skin: Skin is warm and dry.  Psychiatric: She has a normal mood and affect. Her behavior is normal. Thought content normal.   Results for orders placed or performed during the hospital encounter of 12/05/14 (from the past 48 hour(s))   Urinalysis, Routine w reflex microscopic (not at [facility])     Status: Abnormal   Collection Time: 12/05/14  4:25 PM  Result Value Ref Range   Color, Urine YELLOW YELLOW   APPearance HAZY (A) CLEAR   Specific Gravity, Urine 1.015 1.005 - 1.030   pH 7.0 5.0 - 8.0   Glucose, UA NEGATIVE NEGATIVE mg/dL   Hgb urine dipstick NEGATIVE NEGATIVE   Bilirubin Urine NEGATIVE NEGATIVE   Ketones, ur NEGATIVE NEGATIVE mg/dL   Protein, ur NEGATIVE NEGATIVE mg/dL   Urobilinogen, UA 0.2 0.0 - 1.0 mg/dL   Nitrite NEGATIVE NEGATIVE   Leukocytes, UA NEGATIVE NEGATIVE    Comment: MICROSCOPIC NOT DONE ON URINES WITH NEGATIVE PROTEIN, BLOOD, LEUKOCYTES, NITRITE, OR GLUCOSE <1000 mg/dL.    MAU Course  Procedures  MDM MSE Exam  Assessment and Plan  A:  Pelvic pressure at [redacted]w[redacted]d gestation       Current yeast infection-not yet treated  P:  Encouraged patient to pick up Rx and begin tonight       Reassured she does not have uterine prolapse       Keep scheduled appointment with Dr. Clearance Coots for continued prenatal care  Alyssabeth Bruster,EVE M 12/05/2014, 6:09 PM

## 2014-12-08 ENCOUNTER — Other Ambulatory Visit: Payer: Self-pay | Admitting: Obstetrics

## 2014-12-08 ENCOUNTER — Ambulatory Visit (HOSPITAL_COMMUNITY)
Admission: RE | Admit: 2014-12-08 | Discharge: 2014-12-08 | Disposition: A | Discharge status: Home / Self Care | Source: Ambulatory Visit | Attending: Obstetrics | Admitting: Obstetrics

## 2014-12-08 DIAGNOSIS — O26891 Other specified pregnancy related conditions, first trimester: Secondary | ICD-10-CM

## 2014-12-08 DIAGNOSIS — R102 Pelvic and perineal pain: Secondary | ICD-10-CM

## 2014-12-08 DIAGNOSIS — O468X1 Other antepartum hemorrhage, first trimester: Secondary | ICD-10-CM

## 2014-12-08 DIAGNOSIS — N949 Unspecified condition associated with female genital organs and menstrual cycle: Secondary | ICD-10-CM

## 2014-12-08 DIAGNOSIS — Z3A12 12 weeks gestation of pregnancy: Secondary | ICD-10-CM | POA: Diagnosis not present

## 2014-12-08 DIAGNOSIS — O208 Other hemorrhage in early pregnancy: Secondary | ICD-10-CM | POA: Insufficient documentation

## 2014-12-08 DIAGNOSIS — O418X10 Other specified disorders of amniotic fluid and membranes, first trimester, not applicable or unspecified: Secondary | ICD-10-CM

## 2014-12-15 ENCOUNTER — Encounter: Payer: Self-pay | Admitting: Obstetrics

## 2014-12-15 ENCOUNTER — Ambulatory Visit (INDEPENDENT_AMBULATORY_CARE_PROVIDER_SITE_OTHER): Admitting: Obstetrics

## 2014-12-15 VITALS — BP 137/73 | HR 76 | Temp 98.3°F | Wt 245.0 lb

## 2014-12-15 DIAGNOSIS — Z3481 Encounter for supervision of other normal pregnancy, first trimester: Secondary | ICD-10-CM

## 2014-12-16 NOTE — Progress Notes (Signed)
  Subjective:    Veronica Wilkerson is a 29 y.o. female being seen today for her obstetrical visit. She is at 3933w5d gestation. Patient reports: no complaints.  Problem List Items Addressed This Visit    None    Visit Diagnoses    Supervision of other normal pregnancy, antepartum, first trimester    -  Primary    Relevant Orders    POCT urinalysis dipstick      There are no active problems to display for this patient.   Objective:     BP 137/73 mmHg  Pulse 76  Temp(Src) 98.3 F (36.8 C)  Wt 245 lb (111.131 kg)  LMP 09/19/2014 Uterine Size: Below umbilicus     Assessment:    Pregnancy @ 5633w5d  weeks Doing well    Plan:    Problem list reviewed and updated. Labs reviewed.  Follow up in 4 weeks. FIRST/CF mutation testing/NIPT/QUAD SCREEN/fragile X/Ashkenazi Jewish population testing/Spinal muscular atrophy discussed: requested. Role of ultrasound in pregnancy discussed; fetal survey: requested. Amniocentesis discussed: not indicated.

## 2015-01-09 ENCOUNTER — Other Ambulatory Visit: Payer: Self-pay | Admitting: Certified Nurse Midwife

## 2015-01-12 ENCOUNTER — Ambulatory Visit (INDEPENDENT_AMBULATORY_CARE_PROVIDER_SITE_OTHER): Admitting: Certified Nurse Midwife

## 2015-01-12 VITALS — BP 106/72 | HR 96 | Temp 98.7°F | Wt 243.0 lb

## 2015-01-12 DIAGNOSIS — Z3482 Encounter for supervision of other normal pregnancy, second trimester: Secondary | ICD-10-CM

## 2015-01-12 DIAGNOSIS — J069 Acute upper respiratory infection, unspecified: Secondary | ICD-10-CM

## 2015-01-12 DIAGNOSIS — D561 Beta thalassemia: Secondary | ICD-10-CM

## 2015-01-12 LAB — POCT URINALYSIS DIPSTICK
BILIRUBIN UA: NEGATIVE
Glucose, UA: NEGATIVE
Leukocytes, UA: NEGATIVE
Nitrite, UA: NEGATIVE
PH UA: 7
PROTEIN UA: NEGATIVE
RBC UA: NEGATIVE
SPEC GRAV UA: 1.01
Urobilinogen, UA: NEGATIVE

## 2015-01-12 MED ORDER — DIPHENHYDRAMINE HCL 25 MG PO TABS: 50.0000 mg | ORAL_TABLET | Freq: Four times a day (QID) | ORAL | Status: DC | PRN

## 2015-01-12 MED ORDER — VITAFOL GUMMIES 3.33-0.333-34.8 MG PO CHEW: 3.0000 | CHEWABLE_TABLET | Freq: Every day | ORAL | Status: DC

## 2015-01-12 NOTE — Progress Notes (Signed)
  Subjective:    Veronica Wilkerson is a 29 y.o. female being seen today for her obstetrical visit. She is at 7478w4d gestation. Patient reports: no bleeding, no contractions, no cramping, no leaking and states that her nausea is improved.  No longer taking Diclegis.  Reports fatigue, has had a URI for about a week, denies any fever or green/yellow mucous, denies any coughing.  Problem List Items Addressed This Visit    None    Visit Diagnoses    Encounter for supervision of other normal pregnancy in second trimester    -  Primary    Relevant Orders    POCT urinalysis dipstick (Completed)    AMB Referral to Maternal Fetal Medicine (MFM)    US MFM OB COMP + 14 WK    Beta thalassemia (HCC)        Relevant Orders    AMB Referral to Maternal Fetal Medicine (MFM)    US MFM OB COMP + 14 WK      There are no active problems to display for this patient.   Objective:     BP 106/72 mmHg  Pulse 96  Temp(Src) 98.7 F (37.1 C)  Wt 243 lb (110.224 kg)  LMP 09/19/2014 Uterine Size: Below umbilicus   FHR about 160 BMP on bedside US  Lungs:  CTA anterior and posterior, bilaterally  Assessment:    Pregnancy @ 8078w4d  weeks obesity    Beta Thalassemia URI Plan:    Problem list reviewed and updated. Labs reviewed.  Follow up in 4 weeks. FIRST/CF mutation testing/NIPT/QUAD SCREEN/fragile X/Ashkenazi Jewish population testing/Spinal muscular atrophy discussed: declined. Role of ultrasound in pregnancy discussed; fetal survey: ordered. Amniocentesis discussed: not indicated. 50% of 25 minute visit spent on counseling and coordination of care.

## 2015-01-14 ENCOUNTER — Telehealth: Payer: Self-pay | Admitting: *Deleted

## 2015-01-14 ENCOUNTER — Other Ambulatory Visit: Payer: Self-pay | Admitting: *Deleted

## 2015-01-14 DIAGNOSIS — Z3492 Encounter for supervision of normal pregnancy, unspecified, second trimester: Secondary | ICD-10-CM

## 2015-01-14 MED ORDER — VITAFOL GUMMIES 3.33-0.333-34.8 MG PO CHEW: 3.0000 | CHEWABLE_TABLET | Freq: Every day | ORAL | Status: DC

## 2015-01-14 MED ORDER — VITAFOL GUMMIES 3.33-0.333-34.8 MG PO CHEW: 3.0000 | CHEWABLE_TABLET | Freq: Every day | ORAL | Status: AC

## 2015-01-14 NOTE — Telephone Encounter (Signed)
Patient lost her Rx for PNV- can we call it in to new pharmacy. Patient also is feeling some pelvic pressure that is different. No change in discharge- she has been laying down all day and she says her pelvis feels pressure. Scheduled patient for Friday check- but if her symptoms get worse she is advised to go to MAU.

## 2015-01-15 ENCOUNTER — Inpatient Hospital Stay (HOSPITAL_COMMUNITY)

## 2015-01-15 ENCOUNTER — Encounter (HOSPITAL_COMMUNITY): Payer: Self-pay

## 2015-01-15 ENCOUNTER — Inpatient Hospital Stay (HOSPITAL_COMMUNITY)
Admission: AD | Admit: 2015-01-15 | Discharge: 2015-01-15 | Disposition: A | Discharge status: Home / Self Care | Source: Ambulatory Visit | Attending: Obstetrics | Admitting: Obstetrics

## 2015-01-15 DIAGNOSIS — R102 Pelvic and perineal pain: Secondary | ICD-10-CM | POA: Insufficient documentation

## 2015-01-15 DIAGNOSIS — O34219 Maternal care for unspecified type scar from previous cesarean delivery: Secondary | ICD-10-CM | POA: Diagnosis not present

## 2015-01-15 DIAGNOSIS — O26899 Other specified pregnancy related conditions, unspecified trimester: Secondary | ICD-10-CM

## 2015-01-15 DIAGNOSIS — O99012 Anemia complicating pregnancy, second trimester: Secondary | ICD-10-CM | POA: Insufficient documentation

## 2015-01-15 DIAGNOSIS — R1084 Generalized abdominal pain: Secondary | ICD-10-CM

## 2015-01-15 DIAGNOSIS — Z3A16 16 weeks gestation of pregnancy: Secondary | ICD-10-CM | POA: Diagnosis not present

## 2015-01-15 DIAGNOSIS — Z3A17 17 weeks gestation of pregnancy: Secondary | ICD-10-CM | POA: Insufficient documentation

## 2015-01-15 DIAGNOSIS — R109 Unspecified abdominal pain: Secondary | ICD-10-CM | POA: Insufficient documentation

## 2015-01-15 DIAGNOSIS — D561 Beta thalassemia: Secondary | ICD-10-CM | POA: Diagnosis not present

## 2015-01-15 DIAGNOSIS — O26892 Other specified pregnancy related conditions, second trimester: Secondary | ICD-10-CM | POA: Diagnosis not present

## 2015-01-15 DIAGNOSIS — N949 Unspecified condition associated with female genital organs and menstrual cycle: Secondary | ICD-10-CM

## 2015-01-15 DIAGNOSIS — O99212 Obesity complicating pregnancy, second trimester: Secondary | ICD-10-CM

## 2015-01-15 LAB — WET PREP, GENITAL
CLUE CELLS WET PREP: NONE SEEN
Sperm: NONE SEEN
Trich, Wet Prep: NONE SEEN
YEAST WET PREP: NONE SEEN

## 2015-01-15 LAB — URINALYSIS, ROUTINE W REFLEX MICROSCOPIC
Bilirubin Urine: NEGATIVE
GLUCOSE, UA: NEGATIVE mg/dL
Hgb urine dipstick: NEGATIVE
KETONES UR: 15 mg/dL — AB
LEUKOCYTES UA: NEGATIVE
Nitrite: NEGATIVE
PROTEIN: NEGATIVE mg/dL
Specific Gravity, Urine: 1.015 (ref 1.005–1.030)
pH: 7.5 (ref 5.0–8.0)

## 2015-01-15 NOTE — MAU Provider Note (Signed)
Chief Complaint: Pelvic Pain  First Provider Initiated Contact with Patient 01/15/15 1505     SUBJECTIVE HPI: Veronica Wilkerson is a 29 y.o. G3P0003 at [redacted]w[redacted]d who presents to Maternity Admissions reporting constant pelvic pressure since yesterday that she rates 7/10 on a pain scale. No improvement with rest. Plans [facility] at [facility].  Modifying factors: None Associated signs and symptoms: Denies vaginal bleeding, vaginal discharge, GI complaints or urinary complaints.   Past Medical History  Diagnosis Date  . Asthma    OB History  Gravida Para Term Preterm AB SAB TAB Ectopic Multiple Living  0 0 0 0 1 3    # Outcome Date GA Lbr Len/2nd Weight Sex Delivery Anes PTL Lv  3 Current           2 Term 06/22/09    Heide Scales  1A Preterm 01/03/07 [redacted]w[redacted]d   M CS-LTranv   Y  1B Preterm 01/03/07 [redacted]w[redacted]d   Cloyd Stagers     Past Surgical History  Procedure Laterality Date  . Cesarean section    . Wisdom tooth extraction     Social History   Social History  . Marital Status: Married    Spouse Name: N/A  . Number of Children: N/A  . Years of Education: N/A   Occupational History  . Not on file.   Social History Main Topics  . Smoking status: Never Smoker   . Smokeless tobacco: Not on file  . Alcohol Use: 0.0 oz/week    0 Standard drinks or equivalent per week     Comment: occ, not currently   . Drug Use: No  . Sexual Activity: Not on file   Other Topics Concern  . Not on file   Social History Narrative   No current facility-administered medications on file prior to encounter.   Current Outpatient Prescriptions on File Prior to Encounter  Medication Sig Dispense Refill  . albuterol (ACCUNEB) 0.63 MG/3ML nebulizer solution Take 1 ampule by nebulization every 6 (six) hours as needed for wheezing.    . diphenhydrAMINE (BENADRYL) 25 MG tablet Take 2 tablets (50 mg total) by mouth every 6 (six) hours as needed. (Patient taking differently: Take 50 mg by mouth  every 6 (six) hours as needed for allergies or sleep. ) 90 tablet 4  . Prenatal Vit-Fe Phos-FA-Omega (VITAFOL GUMMIES) 3.33-0.333-34.8 MG CHEW Chew 3 tablets by mouth daily. 90 tablet 12   No Known Allergies  I have reviewed the past Medical Hx, Surgical Hx, Social Hx, Allergies and Medications.   Review of Systems  Constitutional: Negative for fever and chills.    OBJECTIVE Patient Vitals for the past 24 hrs:  BP Temp Temp src Pulse Resp Height Weight  01/15/15 1501 116/69 mmHg 98.3 F (36.8 C) Oral 88 17 - -  01/15/15 1127 138/71 mmHg 98.2 F (36.8 C) Oral 103 18  (1.676 m) 242 lb 9.6 oz (110.043 kg)   Constitutional: Well-developed, well-nourished female in no acute distress.  Cardiovascular: normal rate Respiratory: normal rate and effort.  GI: Abd soft, non-tender, gravid appropriate for gestational age. Pos BS x 4 MS: Extremities nontender, no edema, normal ROM Neurologic: Alert and oriented x 4.  GU: Neg CVAT.  SPECULUM EXAM: NEFG, physiologic discharge, no blood noted, cervix clean  BIMANUAL: cervix long and closed; uterus 18-week size, no adnexal tenderness or masses. No CMT.  Fetal heart rate 156 by Doppler.  LAB RESULTS Results for  orders placed or performed during the hospital encounter of 01/15/15 (from the past 24 hour(s))  Urinalysis, Routine w reflex microscopic (not at [facility])     Status: Abnormal   Collection Time: 01/15/15 11:30 AM  Result Value Ref Range   Color, Urine YELLOW YELLOW   APPearance HAZY (A) CLEAR   Specific Gravity, Urine 1.015 1.005 - 1.030   pH 7.5 5.0 - 8.0   Glucose, UA NEGATIVE NEGATIVE mg/dL   Hgb urine dipstick NEGATIVE NEGATIVE   Bilirubin Urine NEGATIVE NEGATIVE   Ketones, ur 15 (A) NEGATIVE mg/dL   Protein, ur NEGATIVE NEGATIVE mg/dL   Nitrite NEGATIVE NEGATIVE   Leukocytes, UA NEGATIVE NEGATIVE  Wet prep, genital     Status: Abnormal   Collection Time: 01/15/15 12:10 PM  Result Value Ref Range   Yeast Wet Prep HPF POC  NONE SEEN NONE SEEN   Trich, Wet Prep NONE SEEN NONE SEEN   Clue Cells Wet Prep HPF POC NONE SEEN NONE SEEN   WBC, Wet Prep HPF POC FEW (A) NONE SEEN   Sperm NONE SEEN     IMAGING CL .5 cm.  MAU COURSE UA, wet prep, GC/chlamydia cultures, cervical exam.  Discussed Hx, exam and labs w/ Dr. Clearance CootsHarper. Will order US for CL.  MDM 29 year old female at 8117 weeks gestation with pelvic pressure but no evidence of cervical shortening, infection or other emergent causes of pain. Pain likely either round ligament pain or from normal pregnancy changes exacerbated by patient's morbid obesity.  ASSESSMENT 1. Pelvic pressure in pregnancy, antepartum, second trimester   2. Abdominal pain in pregnancy   3. [redacted] weeks gestation of pregnancy   4. Beta thalassemia (HCC)   5. Obesity complicating pregnancy in second trimester   6. Previous cesarean delivery, antepartum condition or complication     PLAN Discharge home in stable condition per consult . Second trimester Precautions Increase fluids and rest.     Follow-up Information    Follow [facility] with [facility] On 01/16/2015.   Why:  Routine prenatal visit   Contact information:   [address] [place] [phone]      Follow up with [facility].   Why:  As needed in emergencies   Contact information:   [address] [id number] [place] [phone]       Medication List    TAKE these medications        albuterol 0.63 MG/3ML nebulizer solution  Commonly known as:  ACCUNEB  Take 1 ampule by nebulization every 6 (six) hours as needed for wheezing.     albuterol 108 (90 BASE) MCG/ACT inhaler  Commonly known as:  PROVENTIL HFA;VENTOLIN HFA  Inhale 1-2 puffs into the lungs every 6 (six) hours as needed for wheezing or shortness of breath.     diphenhydrAMINE 25 MG tablet  Commonly known as:  BENADRYL   Take 2 tablets (50 mg total) by mouth every 6 (six) hours as needed.     SUDAFED PO  Take 1 tablet by mouth daily as needed (for congestion).     VITAFOL GUMMIES 3.33-0.333-34.8 MG Chew  Chew 3 tablets by mouth daily.         ByersVirginia Maghan Jessee, CNM 01/15/2015  3:04 PM

## 2015-01-15 NOTE — MAU Note (Signed)
Pt report she has been having increasing pevic pressure since yesterday.

## 2015-01-15 NOTE — Discharge Instructions (Signed)
Round Ligament Pain  The round ligament is a cord of muscle and tissue that helps to support the uterus. It can become a source of pain during pregnancy if it becomes stretched or twisted as the baby grows. The pain usually begins in the second trimester of pregnancy, and it can come and go until the baby is delivered. It is not a serious problem, and it does not cause harm to the baby.  Round ligament pain is usually a short, sharp, and pinching pain, but it can also be a dull, lingering, and aching pain. The pain is felt in the lower side of the abdomen or in the groin. It usually starts deep in the groin and moves up to the outside of the hip area. Pain can occur with:   A sudden change in position.   Rolling over in bed.   Coughing or sneezing.   Physical activity.  HOME CARE INSTRUCTIONS  Watch your condition for any changes. Take these steps to help with your pain:   When the pain starts, relax. Then try:    Sitting down.    Flexing your knees up to your abdomen.    Lying on your side with one pillow under your abdomen and another pillow between your legs.    Sitting in a warm bath for 15-20 minutes or until the pain goes away.   Take over-the-counter and prescription medicines only as told by your health care provider.   Move slowly when you sit and stand.   Avoid long walks if they cause pain.   Stop or lessen your physical activities if they cause pain.  SEEK MEDICAL CARE IF:   Your pain does not go away with treatment.   You feel pain in your back that you did not have before.   Your medicine is not helping.  SEEK IMMEDIATE MEDICAL CARE IF:   You develop a fever or chills.   You develop uterine contractions.   You develop vaginal bleeding.   You develop nausea or vomiting.   You develop diarrhea.   You have pain when you urinate.     This information is not intended to replace advice given to you by your health care provider. Make sure you discuss any questions you have with your health  care provider.     Document Released: 11/03/2007 Document Revised: 04/18/2011 Document Reviewed: 04/02/2014  Elsevier Interactive Patient Education 2016 [employer]

## 2015-01-16 ENCOUNTER — Encounter: Admitting: Obstetrics

## 2015-01-16 LAB — GC/CHLAMYDIA PROBE AMP (~~LOC~~) NOT AT ARMC
Chlamydia: NEGATIVE
Neisseria Gonorrhea: NEGATIVE

## 2015-01-26 ENCOUNTER — Other Ambulatory Visit: Payer: Self-pay | Admitting: *Deleted

## 2015-01-26 DIAGNOSIS — B379 Candidiasis, unspecified: Secondary | ICD-10-CM

## 2015-01-26 MED ORDER — TERCONAZOLE 0.4 % VA CREA: 1.0000 | TOPICAL_CREAM | Freq: Every day | VAGINAL | Status: DC

## 2015-01-26 NOTE — Progress Notes (Signed)
Pt called to office with symptoms of yeast and would like medication. Call placed to pt and she complains of being "really itchy". Per protocol pt advised that treatment could be sent to pharmacy.  Pt advised if treatment does not resolve symptoms to call office for appt.

## 2015-01-28 ENCOUNTER — Inpatient Hospital Stay (HOSPITAL_COMMUNITY)
Admission: AD | Admit: 2015-01-28 | Discharge: 2015-01-28 | Disposition: A | Discharge status: Home / Self Care | Source: Ambulatory Visit | Attending: Obstetrics | Admitting: Obstetrics

## 2015-01-28 ENCOUNTER — Encounter (HOSPITAL_COMMUNITY): Payer: Self-pay | Admitting: *Deleted

## 2015-01-28 ENCOUNTER — Telehealth: Payer: Self-pay | Admitting: *Deleted

## 2015-01-28 DIAGNOSIS — R51 Headache: Secondary | ICD-10-CM

## 2015-01-28 DIAGNOSIS — Z3A18 18 weeks gestation of pregnancy: Secondary | ICD-10-CM | POA: Diagnosis not present

## 2015-01-28 DIAGNOSIS — O26892 Other specified pregnancy related conditions, second trimester: Secondary | ICD-10-CM | POA: Diagnosis not present

## 2015-01-28 DIAGNOSIS — G43009 Migraine without aura, not intractable, without status migrainosus: Secondary | ICD-10-CM | POA: Diagnosis not present

## 2015-01-28 DIAGNOSIS — G43001 Migraine without aura, not intractable, with status migrainosus: Secondary | ICD-10-CM | POA: Diagnosis not present

## 2015-01-28 DIAGNOSIS — R519 Headache, unspecified: Secondary | ICD-10-CM

## 2015-01-28 LAB — URINALYSIS, ROUTINE W REFLEX MICROSCOPIC
Bilirubin Urine: NEGATIVE
GLUCOSE, UA: NEGATIVE mg/dL
HGB URINE DIPSTICK: NEGATIVE
KETONES UR: NEGATIVE mg/dL
Leukocytes, UA: NEGATIVE
Nitrite: NEGATIVE
PROTEIN: NEGATIVE mg/dL
Specific Gravity, Urine: 1.01 (ref 1.005–1.030)
pH: 6 (ref 5.0–8.0)

## 2015-01-28 MED ORDER — DEXAMETHASONE SODIUM PHOSPHATE 10 MG/ML IJ SOLN
10.0000 mg | Freq: Once | INTRAMUSCULAR | Status: AC
  Administered 2015-01-28: 10 mg via INTRAVENOUS
  Filled 2015-01-28: qty 1

## 2015-01-28 MED ORDER — LACTATED RINGERS IV BOLUS (SEPSIS)
1000.0000 mL | Freq: Once | INTRAVENOUS | Status: AC
  Administered 2015-01-28: 1000 mL via INTRAVENOUS

## 2015-01-28 MED ORDER — CYCLOBENZAPRINE HCL 10 MG PO TABS: 10.0000 mg | ORAL_TABLET | Freq: Three times a day (TID) | ORAL | Status: DC | PRN

## 2015-01-28 MED ORDER — DIPHENHYDRAMINE HCL 50 MG/ML IJ SOLN
25.0000 mg | Freq: Once | INTRAMUSCULAR | Status: AC
  Administered 2015-01-28: 25 mg via INTRAVENOUS
  Filled 2015-01-28: qty 1

## 2015-01-28 MED ORDER — PROMETHAZINE HCL 25 MG/ML IJ SOLN
25.0000 mg | Freq: Once | INTRAMUSCULAR | Status: AC
  Administered 2015-01-28: 25 mg via INTRAVENOUS
  Filled 2015-01-28: qty 1

## 2015-01-28 NOTE — Telephone Encounter (Signed)
Patient called and stated that she has had a headache that will not go away for 3-4 days now. Per discussion with Dr Clearance Cootsharper- patient needs to go to MAU for evaluation and treatment. 1:00 Call to patient- notified of recommendation.

## 2015-01-28 NOTE — MAU Provider Note (Signed)
History     CSN: [id number]  Arrival date and time: 01/28/15 1641   First Provider Initiated Contact with Patient 01/28/15 1704      Chief Complaint  Patient presents with  . Headache   HPI Veronica Wilkerson 29 y.o. [id number]  presents to MAU with several days of a severe headache.  She has a h/o headache.  Today, it is located bilat temporal and frontal, R >L.  There is pulsating.  It is worse with movement.  It is severe.  Bright lights and loud noises make it worse.  No concern with nausea, vomiting.  No aura.  There is also pain in the neck and shoulders.   She thinks there is fetal movement.  No abdominal pain.  NO vaginal bleeding, weakness, fever.   OB History    Gravida Para Term Preterm AB TAB SAB Ectopic Multiple Living   0 0 0 0 1 3      Past Medical History  Diagnosis Date  . Asthma     Past Surgical History  Procedure Laterality Date  . Cesarean section    . Wisdom tooth extraction      Family History  Problem Relation Age of Onset  . Diabetes Mother   . Hypertension Mother     Social History  Substance Use Topics  . Smoking status: Never Smoker   . Smokeless tobacco: None  . Alcohol Use: 0.0 oz/week    0 Standard drinks or equivalent per week     Comment: occ, not currently     Allergies: No Known Allergies  Prescriptions prior to admission  Medication Sig Dispense Refill Last Dose  . albuterol (ACCUNEB) 0.63 MG/3ML nebulizer solution Take 1 ampule by nebulization every 6 (six) hours as needed for wheezing.   prn  . albuterol (PROVENTIL HFA;VENTOLIN HFA) 108 (90 BASE) MCG/ACT inhaler Inhale 1-2 puffs into the lungs every 6 (six) hours as needed for wheezing or shortness of breath.   prn  . diphenhydrAMINE (BENADRYL) 25 MG tablet Take 2 tablets (50 mg total) by mouth every 6 (six) hours as needed. (Patient taking differently: Take 50 mg by mouth every 6 (six) hours as needed for allergies or sleep. ) 90 tablet 4 prn  . Prenatal Vit-Fe  Phos-FA-Omega (VITAFOL GUMMIES) 3.33-0.333-34.8 MG CHEW Chew 3 tablets by mouth daily. 90 tablet 12 new rx  . Pseudoephedrine HCl (SUDAFED PO) Take 1 tablet by mouth daily as needed (for congestion).   01/14/2015 at Unknown time  . terconazole (TERAZOL 7) 0.4 % vaginal cream Place 1 applicator vaginally at bedtime. 45 g 0     ROS Pertinent ROS in HPI.  All other systems are negative.   Physical Exam   Blood pressure 123/68, pulse 98, temperature 98.3 F (36.8 C), temperature source Oral, resp. rate 18, last menstrual period 09/19/2014.  Physical Exam  Constitutional: She is oriented to person, place, and time. She appears well-developed and well-nourished. No distress.  HENT:  Head: Normocephalic and atraumatic.  Eyes: EOM are normal.  Neck: Normal range of motion. Neck supple.  Cardiovascular: Normal rate and normal heart sounds.   Respiratory: Effort normal. No respiratory distress.  GI: Soft. She exhibits no distension. There is no tenderness.  Musculoskeletal: Normal range of motion. She exhibits no edema.  Neurological: She is alert and oriented to person, place, and time.  Skin: Skin is warm and dry.  Psychiatric: She has a normal mood and affect. Her behavior is normal.  Results for orders placed or performed during the hospital encounter of 01/28/15 (from the past 24 hour(s))  Urinalysis, Routine w reflex microscopic (not at [facility])     Status: None   Collection Time: 01/28/15  4:55 PM  Result Value Ref Range   Color, Urine YELLOW YELLOW   APPearance CLEAR CLEAR   Specific Gravity, Urine 1.010 1.005 - 1.030   pH 6.0 5.0 - 8.0   Glucose, UA NEGATIVE NEGATIVE mg/dL   Hgb urine dipstick NEGATIVE NEGATIVE   Bilirubin Urine NEGATIVE NEGATIVE   Ketones, ur NEGATIVE NEGATIVE mg/dL   Protein, ur NEGATIVE NEGATIVE mg/dL   Nitrite NEGATIVE NEGATIVE   Leukocytes, UA NEGATIVE NEGATIVE    MAU Course  Procedures  MDM Pt with 5 day migraine.  Headache cocktail ordered: IV  phenergan, benadryl, dexamethasone with LR IVF.   Pt indicates resolution of HA.  Feeling somnolent.  Ready for discharge  Assessment and Plan  A:  1. Migraine without aura and with status migrainosus, not intractable   2. Headache in pregnancy, second trimester    P: Discharge to home Sleep encouraged.  No further medication tonight.   Rx given for Flexeril to use at home should HA return.  Sedation precautions given.  Keep OB appts Patient may return to MAU as needed or if her condition were to change or worsen   Bertram DenverKaren E Teague Clark 01/28/2015, 5:05 PM

## 2015-01-28 NOTE — Discharge Instructions (Signed)

## 2015-01-28 NOTE — MAU Note (Addendum)
Major headache past 5 days.  Has taken Tylenol and it is not touching it.  Neck and up the back of her head hurt and are stiff

## 2015-02-06 ENCOUNTER — Ambulatory Visit (HOSPITAL_COMMUNITY)
Admission: RE | Admit: 2015-02-06 | Discharge: 2015-02-06 | Disposition: A | Discharge status: Home / Self Care | Source: Ambulatory Visit | Attending: Certified Nurse Midwife | Admitting: Certified Nurse Midwife

## 2015-02-06 ENCOUNTER — Other Ambulatory Visit: Payer: Self-pay | Admitting: Certified Nurse Midwife

## 2015-02-06 DIAGNOSIS — Z315 Encounter for genetic counseling: Secondary | ICD-10-CM | POA: Diagnosis not present

## 2015-02-06 DIAGNOSIS — O34219 Maternal care for unspecified type scar from previous cesarean delivery: Secondary | ICD-10-CM

## 2015-02-06 DIAGNOSIS — Z3A20 20 weeks gestation of pregnancy: Secondary | ICD-10-CM

## 2015-02-06 DIAGNOSIS — Z3689 Encounter for other specified antenatal screening: Secondary | ICD-10-CM

## 2015-02-06 DIAGNOSIS — D582 Other hemoglobinopathies: Secondary | ICD-10-CM

## 2015-02-06 DIAGNOSIS — Z3482 Encounter for supervision of other normal pregnancy, second trimester: Secondary | ICD-10-CM

## 2015-02-06 DIAGNOSIS — D561 Beta thalassemia: Secondary | ICD-10-CM

## 2015-02-06 DIAGNOSIS — Z36 Encounter for antenatal screening of mother: Secondary | ICD-10-CM | POA: Diagnosis not present

## 2015-02-06 DIAGNOSIS — O2690 Pregnancy related conditions, unspecified, unspecified trimester: Secondary | ICD-10-CM

## 2015-02-10 ENCOUNTER — Ambulatory Visit (INDEPENDENT_AMBULATORY_CARE_PROVIDER_SITE_OTHER): Admitting: Certified Nurse Midwife

## 2015-02-10 VITALS — BP 119/67 | HR 92 | Temp 98.2°F | Wt 244.0 lb

## 2015-02-10 DIAGNOSIS — O26892 Other specified pregnancy related conditions, second trimester: Secondary | ICD-10-CM

## 2015-02-10 DIAGNOSIS — N949 Unspecified condition associated with female genital organs and menstrual cycle: Secondary | ICD-10-CM

## 2015-02-10 DIAGNOSIS — Z3A16 16 weeks gestation of pregnancy: Secondary | ICD-10-CM

## 2015-02-10 DIAGNOSIS — L739 Follicular disorder, unspecified: Secondary | ICD-10-CM

## 2015-02-10 DIAGNOSIS — Z3482 Encounter for supervision of other normal pregnancy, second trimester: Secondary | ICD-10-CM

## 2015-02-10 DIAGNOSIS — J069 Acute upper respiratory infection, unspecified: Secondary | ICD-10-CM

## 2015-02-10 DIAGNOSIS — D582 Other hemoglobinopathies: Secondary | ICD-10-CM | POA: Insufficient documentation

## 2015-02-10 DIAGNOSIS — O34219 Maternal care for unspecified type scar from previous cesarean delivery: Secondary | ICD-10-CM

## 2015-02-10 LAB — POCT URINALYSIS DIPSTICK
BILIRUBIN UA: NEGATIVE
Glucose, UA: NEGATIVE
Ketones, UA: NEGATIVE
Leukocytes, UA: NEGATIVE
NITRITE UA: NEGATIVE
PH UA: 6
PROTEIN UA: NEGATIVE
RBC UA: NEGATIVE
Spec Grav, UA: 1.01
UROBILINOGEN UA: NEGATIVE

## 2015-02-10 MED ORDER — AMOXICILLIN-POT CLAVULANATE 875-125 MG PO TABS: 1.0000 | ORAL_TABLET | Freq: Two times a day (BID) | ORAL | Status: AC

## 2015-02-10 MED ORDER — DM-GUAIFENESIN ER 30-600 MG PO TB12: 1.0000 | ORAL_TABLET | Freq: Two times a day (BID) | ORAL | Status: DC

## 2015-02-10 MED ORDER — MUPIROCIN 2 % EX OINT: 1.0000 "application " | TOPICAL_OINTMENT | Freq: Two times a day (BID) | CUTANEOUS | Status: DC

## 2015-02-10 MED ORDER — BACIT-POLY-NEO HC 1 % EX OINT: 1.0000 "application " | TOPICAL_OINTMENT | Freq: Two times a day (BID) | CUTANEOUS | Status: DC

## 2015-02-10 MED ORDER — PSEUDOEPHEDRINE HCL 30 MG PO TABS: 30.0000 mg | ORAL_TABLET | Freq: Four times a day (QID) | ORAL | Status: DC | PRN

## 2015-02-10 NOTE — Progress Notes (Signed)
Genetic Counseling  High-Risk Gestation Note  Appointment Date:  02/10/2015 Referred By: Veronica Wilkerson, CNM Date of Birth:  01-25-1986 Partner:  Veronica Wilkerson   Pregnancy History: [id number] Estimated Date of Delivery: 06/25/15 Estimated Gestational Age: 59w0dAttending: PBenjaman Lobe MD   I met with Veronica Wilkerson her husband, Veronica Wilkerson for genetic counseling given that routine hemoglobin electrophoresis for [name] elevated hemoglobin F.   In Summary:   Hemoglobin electrophoresis performed through OB office identified elevated Hb F  Pattern on hemoglobin electrophoresis is otherwise normal, and previous complete blood count within normal range  Thus, patient's elevated Hb F may be due to an acquired increase from various causes or due to hereditary persistence of fetal hemoglobin (benign condition)  Reviewed with the patient and her partner that the result is NOT consistent with being a carrier of a hemoglobinopathy (such as beta thalassemia trait)  Detailed ultrasound performed today; Complete results reported separately  Follow-up ultrasound scheduled to complete fetal anatomic survey   Mrs. RColclasurehad hemoglobin electrophoresis performed through her OB office as part of routine screening. This test visualized elevated levels of hemoglobin F,, also referred to as fetal hemoglobin. Per the laboratory report, Hb-F is increased for the patient's reported age. Hb-A2 is of normal concentration. No abnormal hemoglobin is identified.   We discussed that hemoglobin is the oxygen-carrying pigment of red blood cells.  The type of hemoglobin we have is determined by inheritance.  Hemoglobin A is the major adult hemoglobin and is made of alpha and beta subunits. Hemoglobin F, is another form of hemoglobin comprised of alpha and gamma subunits, and is also called fetal hemoglobin since it is the predominant hemoglobin present during fetal  gestation. At birth, fetal hemoglobin constitutes approximately 70% of the body's hemoglobin and then declines, making approximately < 1% of overall hemoglobin for adults.   Hb F levels can be increased in adults for various reasons either due to a primary elevation or a secondary elevation. Hb F is an effective oxygen carrier in adults and thus, can be elevated in individuals who lack Hb A. Individuals with sickle cell disease, beta thalassemia, or beta thalassemia trait can have elevated hemoglobin F in response to the decreased Hb A. However, individuals with sickle cell disease or beta thalassemia will have other findings on hemoglobin electrophoresis, such as presence of Hb S or elevated Hb A2. Additionally, carriers of beta thalassemia are typically observed to have elevated HbA2 on hemoglobin electrophoresis in combination with decreased MCV and Hb values on complete blood count. Ms. RCalmesdoes not have these additional findings on her hemoglobin electrophoresis or CBC, thus, her elevated Hb F is not suggestive of beta thalassemia carrier status.   Hemoglobin F can be elevated secondary to a response to acquired or other genetic conditions, aside from hemoglobinopathies. Various factors that have been associated with elevated Hb F include prematurity and infants of mothers with diabetes, both of which can impact the switch from production of Hb F to Hb A. Additionally, pregnancy has been associated with increases of Hb F, with a peak typically observed in the second trimester. Hb F levels can also be impacted by bone marrow regeneration, underlying leukemia, and underlying inherited bone marrow failure syndromes (such as Diamond-Blackfan anemia, Fanconi anemia). We discussed additionally, that there is a group of clinically benign conditions that lead to hereditary persistence of fetal hemoglobin.   Given these associations, a follow-up hemoglobin electrophoresis may be performed after pregnancy  to  further clarify the patient's status. The current pregnancy is not expected to be at increased risk for inherited clinically significant hemoglobinopathies, such as sickle cell or beta thalassemia.   Detailed family histories were not obtained at this time. However, the couple reported no known individuals with birth defects, intellectual disability, or known genetic conditions.   Detailed ultrasound was performed today. Complete ultrasound results reported separately. The couple understands that ultrasound cannot diagnose or rule out all birth defects or genetic conditions.   [name] denied exposure to environmental toxins or chemical agents. She denied the use of alcohol, tobacco or street drugs. She denied significant viral illnesses during the course of her pregnancy. Her medical and surgical histories were noncontributory.   I counseled this couple regarding the above risks and available options.  The approximate face-to-face time with the genetic counselor was 16 minutes.  Veronica Oman, MS Certified Genetic Counselor 02/10/2015

## 2015-02-10 NOTE — Addendum Note (Signed)
Addended by: Henriette CombsHATTON, Tavian Callander L on: 02/10/2015 04:42 PM   Modules accepted: Orders

## 2015-02-10 NOTE — Progress Notes (Signed)
Subjective:    Veronica Wilkerson is a 30 y.o. female being seen today for her obstetrical visit. She is at 5013w5d gestation. Patient reports: no bleeding, no contractions, no cramping, no leaking and URI symptoms, denies fever. Fetal movement: normal.  Problem List Items Addressed This Visit    None    Visit Diagnoses    Supervision of other normal pregnancy, antepartum, second trimester    -  Primary    Relevant Orders    POCT urinalysis dipstick    Pelvic pressure in pregnancy, antepartum, second trimester        [redacted] weeks gestation of pregnancy        Previous cesarean delivery, antepartum condition or complication        URI, acute        Relevant Medications    pseudoephedrine (SUDAFED) 30 MG tablet    dextromethorphan-guaiFENesin (MUCINEX DM) 30-600 MG 12hr tablet      Patient Active Problem List   Diagnosis Date Noted  . 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  Folliculitis with pustule  Plan:    OBGCT: discussed. Signs and symptoms of preterm labor: discussed.  Labs, problem list reviewed and updated 2 hr GTT planned Follow up in 4 weeks.

## 2015-02-15 LAB — WOUND CULTURE
Gram Stain: NONE SEEN
Gram Stain: NONE SEEN

## 2015-02-25 ENCOUNTER — Telehealth: Payer: Self-pay | Admitting: Obstetrics

## 2015-02-27 ENCOUNTER — Ambulatory Visit (INDEPENDENT_AMBULATORY_CARE_PROVIDER_SITE_OTHER): Admitting: Certified Nurse Midwife

## 2015-02-27 VITALS — BP 120/75 | HR 109 | Temp 98.5°F | Wt 245.0 lb

## 2015-02-27 DIAGNOSIS — M5442 Lumbago with sciatica, left side: Secondary | ICD-10-CM

## 2015-02-27 DIAGNOSIS — M5441 Lumbago with sciatica, right side: Secondary | ICD-10-CM

## 2015-02-27 DIAGNOSIS — Z3482 Encounter for supervision of other normal pregnancy, second trimester: Secondary | ICD-10-CM

## 2015-02-27 DIAGNOSIS — N760 Acute vaginitis: Secondary | ICD-10-CM

## 2015-02-27 LAB — POCT URINALYSIS DIPSTICK
Bilirubin, UA: NEGATIVE
Glucose, UA: NEGATIVE
Ketones, UA: NEGATIVE
Leukocytes, UA: NEGATIVE
Nitrite, UA: NEGATIVE
PH UA: 6
PROTEIN UA: NEGATIVE
RBC UA: NEGATIVE
SPEC GRAV UA: 1.015
Urobilinogen, UA: NEGATIVE

## 2015-02-27 MED ORDER — FLUCONAZOLE 100 MG PO TABS: 100.0000 mg | ORAL_TABLET | Freq: Once | ORAL | Status: DC

## 2015-02-27 MED ORDER — ACETAMINOPHEN 500 MG PO TABS: 500.0000 mg | ORAL_TABLET | Freq: Four times a day (QID) | ORAL | Status: AC | PRN

## 2015-02-27 MED ORDER — MICONAZOLE NITRATE 1200 & 2 MG & % VA KIT: 1.0000 | PACK | Freq: Once | VAGINAL | Status: DC

## 2015-02-27 MED ORDER — CYCLOBENZAPRINE HCL 10 MG PO TABS: 10.0000 mg | ORAL_TABLET | Freq: Three times a day (TID) | ORAL | Status: DC | PRN

## 2015-02-28 NOTE — Progress Notes (Signed)
Subjective:    Veronica Wilkerson is a 30 y.o. female being seen today for her obstetrical visit. She is at 28w2dgestation. Patient reports: backache, no bleeding, no contractions, no cramping, no leaking and vaginal irritation . Fetal movement: normal.  Problem List Items Addressed This Visit    None    Visit Diagnoses    Supervision of other normal pregnancy, antepartum, second trimester    -  Primary    Relevant Orders    POCT urinalysis dipstick (Completed)    Vaginitis        Relevant Medications    miconazole (MONISTAT 1 COMBO PACK) kit    fluconazole (DIFLUCAN) 100 MG tablet    Bilateral low back pain with sciatica, sciatica laterality unspecified        Relevant Medications    acetaminophen (TYLENOL) 500 MG tablet    cyclobenzaprine (FLEXERIL) 10 MG tablet      Patient Active Problem List   Diagnosis Date Noted  . 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 Signs and symptoms of preterm labor: discussed.  Labs, problem list reviewed and updated 2 hr GTT planned Follow up in 4 weeks.

## 2015-03-03 NOTE — Telephone Encounter (Signed)
PATIENT VISITS HAVE BEEN SCHEDULED

## 2015-03-06 ENCOUNTER — Ambulatory Visit (HOSPITAL_COMMUNITY)
Admission: RE | Admit: 2015-03-06 | Discharge: 2015-03-06 | Disposition: A | Discharge status: Home / Self Care | Source: Ambulatory Visit | Attending: Certified Nurse Midwife | Admitting: Certified Nurse Midwife

## 2015-03-06 ENCOUNTER — Other Ambulatory Visit (HOSPITAL_COMMUNITY): Payer: Self-pay | Admitting: Maternal and Fetal Medicine

## 2015-03-06 ENCOUNTER — Encounter (HOSPITAL_COMMUNITY): Payer: Self-pay

## 2015-03-06 DIAGNOSIS — O99212 Obesity complicating pregnancy, second trimester: Secondary | ICD-10-CM

## 2015-03-06 DIAGNOSIS — Z0489 Encounter for examination and observation for other specified reasons: Secondary | ICD-10-CM

## 2015-03-06 DIAGNOSIS — Z36 Encounter for antenatal screening of mother: Secondary | ICD-10-CM | POA: Insufficient documentation

## 2015-03-06 DIAGNOSIS — O09212 Supervision of pregnancy with history of pre-term labor, second trimester: Secondary | ICD-10-CM

## 2015-03-06 DIAGNOSIS — Z3A24 24 weeks gestation of pregnancy: Secondary | ICD-10-CM

## 2015-03-06 DIAGNOSIS — IMO0002 Reserved for concepts with insufficient information to code with codable children: Secondary | ICD-10-CM

## 2015-03-06 DIAGNOSIS — O34219 Maternal care for unspecified type scar from previous cesarean delivery: Secondary | ICD-10-CM | POA: Insufficient documentation

## 2015-03-06 DIAGNOSIS — O09892 Supervision of other high risk pregnancies, second trimester: Secondary | ICD-10-CM

## 2015-03-10 ENCOUNTER — Encounter: Admitting: Certified Nurse Midwife

## 2015-03-24 ENCOUNTER — Ambulatory Visit (INDEPENDENT_AMBULATORY_CARE_PROVIDER_SITE_OTHER): Admitting: Certified Nurse Midwife

## 2015-03-24 VITALS — BP 114/72 | HR 112 | Temp 98.4°F | Wt 248.0 lb

## 2015-03-24 DIAGNOSIS — J069 Acute upper respiratory infection, unspecified: Secondary | ICD-10-CM

## 2015-03-24 DIAGNOSIS — Z3482 Encounter for supervision of other normal pregnancy, second trimester: Secondary | ICD-10-CM

## 2015-03-24 LAB — POCT URINALYSIS DIPSTICK
BILIRUBIN UA: NEGATIVE
Blood, UA: NEGATIVE
GLUCOSE UA: NEGATIVE
Ketones, UA: NEGATIVE
LEUKOCYTES UA: NEGATIVE
NITRITE UA: NEGATIVE
Protein, UA: NEGATIVE
Spec Grav, UA: <=1.005
Urobilinogen, UA: NEGATIVE
pH, UA: 7

## 2015-03-24 MED ORDER — GUAIFENESIN ER 600 MG PO TB12: 1200.0000 mg | ORAL_TABLET | Freq: Two times a day (BID) | ORAL | Status: DC

## 2015-03-24 MED ORDER — AMOXICILLIN-POT CLAVULANATE 875-125 MG PO TABS: 1.0000 | ORAL_TABLET | Freq: Two times a day (BID) | ORAL | Status: AC

## 2015-03-24 MED ORDER — DIPHENHYDRAMINE HCL 25 MG PO TABS: 50.0000 mg | ORAL_TABLET | Freq: Four times a day (QID) | ORAL | Status: DC | PRN

## 2015-03-24 NOTE — Progress Notes (Signed)
Subjective:    Veronica Wilkerson is a 30 y.o. female being seen today for her obstetrical visit. She is at [redacted]w[redacted]d gestation. Patient reports: fatigue, headache, no bleeding, no contractions, no cramping, no leaking and URI symptoms since last Thursday.  Denies any fever.  Has been taking sudafed. Reports + sputum production, thick yellow and SOB. Fetal movement: normal.  Problem List Items Addressed This Visit    None    Visit Diagnoses    Supervision of other normal pregnancy, antepartum, second trimester    -  Primary    Relevant Medications    diphenhydrAMINE (BENADRYL) 25 MG tablet    guaiFENesin (MUCINEX) 600 MG 12 hr tablet    Other Relevant Orders    POCT urinalysis dipstick (Completed)    URI (upper respiratory infection)        Relevant Medications    diphenhydrAMINE (BENADRYL) 25 MG tablet    guaiFENesin (MUCINEX) 600 MG 12 hr tablet      Patient Active Problem List   Diagnosis Date Noted  . 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    Assessment:    Pregnancy @ [redacted]w[redacted]d    URI  Plan:    OBGCT: discussed. Signs and symptoms of preterm labor: discussed.  Labs, problem list reviewed and updated 2 hr GTT planned Follow up on Friday for OGTT as scheduled.

## 2015-03-27 ENCOUNTER — Ambulatory Visit (INDEPENDENT_AMBULATORY_CARE_PROVIDER_SITE_OTHER): Admitting: Certified Nurse Midwife

## 2015-03-27 ENCOUNTER — Other Ambulatory Visit

## 2015-03-27 VITALS — BP 109/67 | HR 104 | Temp 98.1°F | Wt 246.0 lb

## 2015-03-27 DIAGNOSIS — Z3483 Encounter for supervision of other normal pregnancy, third trimester: Secondary | ICD-10-CM

## 2015-03-27 DIAGNOSIS — Z9109 Other allergy status, other than to drugs and biological substances: Secondary | ICD-10-CM

## 2015-03-27 LAB — CBC
HEMATOCRIT: 36.2 % (ref 36.0–46.0)
HEMOGLOBIN: 12 g/dL (ref 12.0–15.0)
MCH: 29.2 pg (ref 26.0–34.0)
MCHC: 33.1 g/dL (ref 30.0–36.0)
MCV: 88.1 fL (ref 78.0–100.0)
MPV: 10.2 fL (ref 8.6–12.4)
Platelets: 248 10*3/uL (ref 150–400)
RBC: 4.11 MIL/uL (ref 3.87–5.11)
RDW: 15.5 % (ref 11.5–15.5)
WBC: 9.2 10*3/uL (ref 4.0–10.5)

## 2015-03-27 LAB — POCT URINALYSIS DIPSTICK
Bilirubin, UA: NEGATIVE
GLUCOSE UA: NEGATIVE
Ketones, UA: NEGATIVE
LEUKOCYTES UA: NEGATIVE
NITRITE UA: NEGATIVE
Protein, UA: NEGATIVE
RBC UA: NEGATIVE
Spec Grav, UA: <=1.005
UROBILINOGEN UA: NEGATIVE
pH, UA: 7

## 2015-03-27 MED ORDER — LORATADINE-PSEUDOEPHEDRINE ER 10-240 MG PO TB24: 1.0000 | ORAL_TABLET | Freq: Every day | ORAL | Status: DC

## 2015-03-27 MED ORDER — CROMOLYN SODIUM 5.2 MG/ACT NA AERS: 1.0000 | INHALATION_SPRAY | Freq: Four times a day (QID) | NASAL | Status: AC

## 2015-03-27 NOTE — Progress Notes (Signed)
Subjective:    Veronica Wilkerson is a 30 y.o. female being seen today for her obstetrical visit. She is at [redacted]w[redacted]d gestation. Patient reports backache, no bleeding, no contractions, no cramping, no leaking and reports wearing maternity support belt. Fetal movement: normal.  Desires BTL has tried Mirena IUD.   Problem List Items Addressed This Visit    None    Visit Diagnoses    Supervision of other normal pregnancy, antepartum, third trimester    -  Primary    Relevant Orders    POCT urinalysis dipstick    Glucose Tolerance, 2 Hours w/1 Hour    CBC    HIV antibody    RPR      Patient Active Problem List   Diagnosis Date Noted  . 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 GBS sent TDAP offered  Rhogam given for RH negative Pediatrician: discussed. Infant feeding: plans to breastfeed. Maternity leave: discussed. Cigarette smoking: never smoked. Orders Placed This Encounter  Procedures  . Glucose Tolerance, 2 Hours w/1 Hour  . CBC  . HIV antibody  . RPR  . POCT urinalysis dipstick   No orders of the defined types were placed in this encounter.   Follow up in 2 Weeks.

## 2015-03-27 NOTE — Addendum Note (Signed)
Addended by: Samantha Crimes on: 03/27/2015 10:36 AM   Modules accepted: Orders

## 2015-03-28 LAB — GLUCOSE TOLERANCE, 2 HOURS W/ 1HR
Glucose, 1 hour: 167 mg/dL (ref 70–170)
Glucose, 2 hour: 106 mg/dL (ref 70–139)
Glucose, Fasting: 76 mg/dL (ref 65–99)

## 2015-03-28 LAB — HIV ANTIBODY (ROUTINE TESTING W REFLEX): HIV 1&2 Ab, 4th Generation: NONREACTIVE

## 2015-03-28 LAB — RPR

## 2015-03-30 ENCOUNTER — Encounter (HOSPITAL_COMMUNITY): Payer: Self-pay | Admitting: *Deleted

## 2015-03-30 ENCOUNTER — Inpatient Hospital Stay (HOSPITAL_COMMUNITY)
Admission: AD | Admit: 2015-03-30 | Discharge: 2015-03-30 | Disposition: A | Discharge status: Home / Self Care | Source: Ambulatory Visit | Attending: Obstetrics | Admitting: Obstetrics

## 2015-03-30 DIAGNOSIS — F419 Anxiety disorder, unspecified: Secondary | ICD-10-CM | POA: Diagnosis present

## 2015-03-30 DIAGNOSIS — R4582 Worries: Secondary | ICD-10-CM

## 2015-03-30 DIAGNOSIS — J45909 Unspecified asthma, uncomplicated: Secondary | ICD-10-CM | POA: Insufficient documentation

## 2015-03-30 DIAGNOSIS — Z711 Person with feared health complaint in whom no diagnosis is made: Secondary | ICD-10-CM

## 2015-03-30 DIAGNOSIS — O99343 Other mental disorders complicating pregnancy, third trimester: Secondary | ICD-10-CM

## 2015-03-30 DIAGNOSIS — R4589 Other symptoms and signs involving emotional state: Secondary | ICD-10-CM

## 2015-03-30 MED ORDER — HYDROXYZINE HCL 50 MG PO TABS: 50.0000 mg | ORAL_TABLET | Freq: Three times a day (TID) | ORAL | Status: AC | PRN
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
## 2015-03-30 NOTE — Discharge Instructions (Signed)
Panic Attacks °Panic attacks are sudden, short feelings of great fear or discomfort. You may have them for no reason when you are relaxed, when you are uneasy (anxious), or when you are sleeping.  °HOME CARE °· Take all your medicines as told. °· Check with your doctor before starting new medicines. °· Keep all doctor visits. °GET HELP IF: °· You are not able to take your medicines as told. °· Your symptoms do not get better. °· Your symptoms get worse. °GET HELP RIGHT AWAY IF: °· Your attacks seem different than your normal attacks. °· You have thoughts about hurting yourself or others. °· You take panic attack medicine and you have a side effect. °MAKE SURE YOU: °· Understand these instructions. °· Will watch your condition. °· Will get help right away if you are not doing well or get worse. °  °This information is not intended to replace advice given to you by your health care provider. Make sure you discuss any questions you have with your health care provider. °  °Document Released: 02/26/2010 Document Revised: 11/14/2012 Document Reviewed: 09/07/2012 °Elsevier Interactive Patient Education ©2016 [employer] ° °
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
## 2015-03-30 NOTE — MAU Note (Signed)
URINE IN LAB 

## 2015-04-03 ENCOUNTER — Telehealth: Payer: Self-pay | Admitting: *Deleted

## 2015-04-03 NOTE — Telephone Encounter (Signed)
Pt called to office for lab results. Return call to pt.  Pt was made aware of all results from last visit.

## 2015-04-06 ENCOUNTER — Telehealth: Payer: Self-pay | Admitting: Obstetrics

## 2015-04-09 NOTE — Telephone Encounter (Signed)
CLOSE ENCOUNTER °

## 2015-04-10 ENCOUNTER — Ambulatory Visit (INDEPENDENT_AMBULATORY_CARE_PROVIDER_SITE_OTHER): Admitting: Certified Nurse Midwife

## 2015-04-10 ENCOUNTER — Encounter: Admitting: Certified Nurse Midwife

## 2015-04-10 VITALS — BP 110/69 | HR 113 | Temp 98.3°F | Wt 247.0 lb

## 2015-04-10 DIAGNOSIS — Z3483 Encounter for supervision of other normal pregnancy, third trimester: Secondary | ICD-10-CM

## 2015-04-10 DIAGNOSIS — N760 Acute vaginitis: Secondary | ICD-10-CM

## 2015-04-10 LAB — POCT URINALYSIS DIPSTICK
BILIRUBIN UA: NEGATIVE
GLUCOSE UA: NEGATIVE
KETONES UA: NEGATIVE
Leukocytes, UA: NEGATIVE
NITRITE UA: NEGATIVE
PH UA: 6
RBC UA: NEGATIVE
SPEC GRAV UA: 1.02
Urobilinogen, UA: NEGATIVE

## 2015-04-10 MED ORDER — TERCONAZOLE 0.4 % VA CREA: 1.0000 | TOPICAL_CREAM | Freq: Every day | VAGINAL | Status: DC

## 2015-04-10 MED ORDER — FLUCONAZOLE 100 MG PO TABS: 100.0000 mg | ORAL_TABLET | Freq: Once | ORAL | Status: DC
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
## 2015-04-21 ENCOUNTER — Institutional Professional Consult (permissible substitution)

## 2015-04-21 ENCOUNTER — Ambulatory Visit (INDEPENDENT_AMBULATORY_CARE_PROVIDER_SITE_OTHER): Admitting: Certified Nurse Midwife

## 2015-04-21 VITALS — BP 109/69 | HR 100 | Temp 98.5°F | Wt 245.0 lb

## 2015-04-21 DIAGNOSIS — Z3483 Encounter for supervision of other normal pregnancy, third trimester: Secondary | ICD-10-CM

## 2015-04-21 LAB — POCT URINALYSIS DIPSTICK
BILIRUBIN UA: NEGATIVE
Blood, UA: NEGATIVE
GLUCOSE UA: NEGATIVE
KETONES UA: NEGATIVE
LEUKOCYTES UA: NEGATIVE
Nitrite, UA: NEGATIVE
PROTEIN UA: NEGATIVE
SPEC GRAV UA: 1.01
Urobilinogen, UA: NEGATIVE
pH, UA: 7
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
## 2015-05-01 ENCOUNTER — Inpatient Hospital Stay (HOSPITAL_COMMUNITY)
Admission: AD | Admit: 2015-05-01 | Discharge: 2015-05-01 | Disposition: A | Discharge status: Home / Self Care | Source: Ambulatory Visit | Attending: Obstetrics | Admitting: Obstetrics

## 2015-05-01 ENCOUNTER — Encounter (HOSPITAL_COMMUNITY): Payer: Self-pay | Admitting: *Deleted

## 2015-05-01 DIAGNOSIS — O26893 Other specified pregnancy related conditions, third trimester: Secondary | ICD-10-CM | POA: Insufficient documentation

## 2015-05-01 DIAGNOSIS — J45909 Unspecified asthma, uncomplicated: Secondary | ICD-10-CM | POA: Diagnosis not present

## 2015-05-01 DIAGNOSIS — Z79899 Other long term (current) drug therapy: Secondary | ICD-10-CM | POA: Diagnosis not present

## 2015-05-01 DIAGNOSIS — Z8249 Family history of ischemic heart disease and other diseases of the circulatory system: Secondary | ICD-10-CM | POA: Insufficient documentation

## 2015-05-01 DIAGNOSIS — Z3A32 32 weeks gestation of pregnancy: Secondary | ICD-10-CM | POA: Diagnosis not present

## 2015-05-01 DIAGNOSIS — M5432 Sciatica, left side: Secondary | ICD-10-CM

## 2015-05-01 DIAGNOSIS — O9989 Other specified diseases and conditions complicating pregnancy, childbirth and the puerperium: Secondary | ICD-10-CM | POA: Diagnosis not present

## 2015-05-01 DIAGNOSIS — D582 Other hemoglobinopathies: Secondary | ICD-10-CM

## 2015-05-01 DIAGNOSIS — Z833 Family history of diabetes mellitus: Secondary | ICD-10-CM | POA: Insufficient documentation

## 2015-05-01 DIAGNOSIS — O99513 Diseases of the respiratory system complicating pregnancy, third trimester: Secondary | ICD-10-CM | POA: Diagnosis not present

## 2015-05-01 DIAGNOSIS — M549 Dorsalgia, unspecified: Secondary | ICD-10-CM | POA: Diagnosis present

## 2015-05-01 LAB — URINALYSIS, ROUTINE W REFLEX MICROSCOPIC
BILIRUBIN URINE: NEGATIVE
Glucose, UA: NEGATIVE mg/dL
HGB URINE DIPSTICK: NEGATIVE
Ketones, ur: NEGATIVE mg/dL
Leukocytes, UA: NEGATIVE
Nitrite: NEGATIVE
Protein, ur: NEGATIVE mg/dL
pH: 6 (ref 5.0–8.0)

## 2015-05-01 MED ORDER — CYCLOBENZAPRINE HCL 10 MG PO TABS: 10.0000 mg | ORAL_TABLET | Freq: Three times a day (TID) | ORAL | Status: AC | PRN

## 2015-05-01 MED ORDER — CYCLOBENZAPRINE HCL 10 MG PO TABS
10.0000 mg | ORAL_TABLET | Freq: Once | ORAL | Status: DC
  Filled 2015-05-01: qty 1

## 2015-05-01 NOTE — MAU Provider Note (Signed)
History     CSN: [id number]  Arrival date and time: 05/01/15 4098   First Provider Initiated Contact with Patient 05/01/15 0102      Chief Complaint  Patient presents with  . Back Pain   HPI Comments: Veronica Wilkerson is a 30 y.o. [id number] at [redacted]w[redacted]d who presents today with back pain. She states that the pain starts in her left gluteal, and "shoots" down her left leg. She denies any contractions, VB or LOF. She confirms fetal movement.   Back Pain This is a new problem. The current episode started today. The problem occurs constantly. The pain is present in the gluteal. The quality of the pain is described as shooting. The pain radiates to the left thigh and left foot. The pain is at a severity of 8/10. The pain is worse during the night. The symptoms are aggravated by position. Pertinent negatives include no abdominal pain, dysuria or fever. Risk factors include pregnancy. She has tried heat (tylenol ) for the symptoms. The treatment provided no relief.    Past Medical History  Diagnosis Date  . Asthma     Past Surgical History  Procedure Laterality Date  . Cesarean section    . Wisdom tooth extraction      Family History  Problem Relation Age of Onset  . Diabetes Mother   . Hypertension Mother     Social History  Substance Use Topics  . Smoking status: Never Smoker   . Smokeless tobacco: None  . Alcohol Use: 0.0 oz/week    0 Standard drinks or equivalent per week     Comment: occ, not currently  NONE WHILE  PREG    Allergies: No Known Allergies  Prescriptions prior to admission  Medication Sig Dispense Refill Last Dose  . albuterol (ACCUNEB) 0.63 MG/3ML nebulizer solution Take 1 ampule by nebulization every 6 (six) hours as needed for wheezing.   Past Month at Unknown time  . Prenatal Vit-Fe Phos-FA-Omega (VITAFOL GUMMIES) 3.33-0.333-34.8 MG CHEW Chew 3 tablets by mouth daily. 90 tablet 12 04/30/2015 at Unknown time  . acetaminophen (TYLENOL) 500 MG tablet Take 1  tablet (500 mg total) by mouth every 6 (six) hours as needed. (Patient not taking: Reported on 03/24/2015) 30 tablet 0 Not Taking  . albuterol (PROVENTIL HFA;VENTOLIN HFA) 108 (90 BASE) MCG/ACT inhaler Inhale 1-2 puffs into the lungs every 6 (six) hours as needed for wheezing or shortness of breath.   Taking  . cromolyn (NASALCROM) 5.2 MG/ACT nasal spray Place 1 spray into both nostrils 4 (four) times daily. (Patient not taking: Reported on 04/10/2015) 26 mL 12 Not Taking  . hydrOXYzine (ATARAX/VISTARIL) 50 MG tablet Take 1 tablet (50 mg total) by mouth 3 (three) times daily as needed. (Patient not taking: Reported on 04/10/2015) 30 tablet 0 Not Taking    Review of Systems  Constitutional: Negative for fever and chills.  Gastrointestinal: Negative for nausea, vomiting, abdominal pain, diarrhea and constipation.  Genitourinary: Negative for dysuria, urgency and frequency.  Musculoskeletal: Positive for back pain.   Physical Exam   Blood pressure 131/74, pulse 121, temperature 98.2 F (36.8 C), temperature source Oral, resp. rate 20, height  (1.676 m), weight 112.152 kg (247 lb 4 oz), last menstrual period 09/19/2014, SpO2 97 %.  Physical Exam  Nursing note and vitals reviewed. Constitutional: She is oriented to person, place, and time. She appears well-developed and well-nourished. No distress.  HENT:  Head: Normocephalic.  Cardiovascular: Normal rate.   Respiratory: Effort normal.  GI:  Soft. There is no tenderness. There is no rebound.  Neurological: She is alert and oriented to person, place, and time.  Skin: Skin is warm and dry.  Psychiatric: She has a normal mood and affect.   FHT: 155, moderate with 15x15 accels, no decels Toco: no UCs  MAU Course  Procedures  MDM Patient refusing fexeril today. She states that she has to get up in a few hours to take her kids to school. She would like a rx, so she can take it tomorrow after she takes her kids to school.   Assessment and  Plan   1. High blood hemoglobin F (HCC)   2. Sciatica of left side   3. [redacted] weeks gestation of pregnancy    DC home Comfort measures reviewed  3rd Trimester precautions  PTL precautions  Fetal kick counts RX: flexeril 10mg  TIDPRN #[id number]  Return to MAU as needed FU with OB as planned  Follow-up Information    Follow up with Brock BadHARPER,CHARLES A, MD.   Specialty:  Obstetrics and Gynecology   Why:  As scheduled   Contact information:   [address] [place] [address] [phone]         Tawnya CrookHogan, Veronica Donovan 05/01/2015, 1:07 AM

## 2015-05-01 NOTE — Discharge Instructions (Signed)

## 2015-05-01 NOTE — MAU Note (Signed)
PT SAYS HER BACK HURTS    AND LEG  / GROINS  HURTS   - STARTED   TODAY.   WAS IN OFFICE  LAST WEEK-   ALL OK.

## 2015-05-04 ENCOUNTER — Other Ambulatory Visit: Payer: Self-pay | Admitting: Certified Nurse Midwife

## 2015-05-08 ENCOUNTER — Ambulatory Visit (INDEPENDENT_AMBULATORY_CARE_PROVIDER_SITE_OTHER): Admitting: Certified Nurse Midwife

## 2015-05-08 VITALS — BP 106/71 | HR 119 | Temp 98.3°F | Wt 243.0 lb

## 2015-05-08 DIAGNOSIS — Z3483 Encounter for supervision of other normal pregnancy, third trimester: Secondary | ICD-10-CM

## 2015-05-08 LAB — POCT URINALYSIS DIPSTICK
Bilirubin, UA: NEGATIVE
Blood, UA: NEGATIVE
Glucose, UA: NEGATIVE
KETONES UA: NEGATIVE
LEUKOCYTES UA: NEGATIVE
NITRITE UA: NEGATIVE
PH UA: 7.5
Protein, UA: NEGATIVE
Spec Grav, UA: <=1.005
Urobilinogen, UA: NEGATIVE

## 2015-05-08 NOTE — Progress Notes (Signed)
Subjective:    Veronica Wilkerson is a 30 y.o. female being seen today for her obstetrical visit. She is at 9854w1d gestation. Patient reports backache, no bleeding, no contractions, no cramping and no leaking. Fetal movement: normal.  [facility] [place].  Is in the process of moving d/t spousal relations and PTSD has gotten worse.  Is currently staying with [place] mother in [place].  Encouragement given.  Records release signed.    Problem List Items Addressed This Visit    None    Visit Diagnoses    Supervision of other normal pregnancy, antepartum, third trimester    -  Primary    Relevant Orders    POCT urinalysis dipstick      Patient Active Problem List   Diagnosis Date Noted  . High blood hemoglobin F (HCC) 02/10/2015   Objective:    BP 106/71 mmHg  Pulse 119  Temp(Src) 98.3 F (36.8 C)  Wt 243 lb (110.224 kg)  LMP 09/19/2014 FHT:  145 BPM  Uterine Size: unable to determine d/t body habitus  Presentation: cephalic     Assessment:    Pregnancy @ 6254w1d weeks   Prior VBAC, 1st C-section was for twins  Plan:     labs reviewed, problem list updated Consent signed. GBS planning TDAP offered  Rhogam given for RH negative Pediatrician: discussed. Infant feeding: plans to breastfeed. Maternity leave: N/A. Cigarette smoking: never smoked. Orders Placed This Encounter  Procedures  . POCT urinalysis dipstick   No orders of the defined types were placed in this encounter.   Follow up in 2 Weeks with GBS.

## 2015-05-11 ENCOUNTER — Telehealth: Payer: Self-pay | Admitting: *Deleted

## 2015-05-11 NOTE — Telephone Encounter (Signed)
Patient has yeast infection and would like something sent in.

## 2015-05-13 ENCOUNTER — Other Ambulatory Visit: Payer: Self-pay | Admitting: Certified Nurse Midwife

## 2015-05-13 DIAGNOSIS — B3731 Acute candidiasis of vulva and vagina: Secondary | ICD-10-CM

## 2015-05-13 DIAGNOSIS — B373 Candidiasis of vulva and vagina: Secondary | ICD-10-CM

## 2015-05-13 MED ORDER — FLUCONAZOLE 100 MG PO TABS: 100.0000 mg | ORAL_TABLET | Freq: Once | ORAL | Status: AC

## 2015-05-13 MED ORDER — TERCONAZOLE 0.4 % VA CREA: 1.0000 | TOPICAL_CREAM | Freq: Every day | VAGINAL | Status: AC

## 2015-05-13 NOTE — Telephone Encounter (Signed)
I have sent in diflucan and terconazole cream to her [facility] on file, she may need to have the Rx's transferred closer to where she is staying.  Thank you.  R.Jj Enyeart CNM

## 2015-05-22 ENCOUNTER — Encounter: Admitting: Certified Nurse Midwife

## 2015-05-26 ENCOUNTER — Telehealth: Payer: Self-pay

## 2015-05-26 NOTE — Telephone Encounter (Signed)
we [facility] and [facility] - faxed to them on 05/26/15 - to [phone] as requested

## 2015-07-08 ENCOUNTER — Other Ambulatory Visit: Payer: Self-pay | Admitting: Certified Nurse Midwife

## 2015-09-15 ENCOUNTER — Encounter (HOSPITAL_COMMUNITY): Payer: Self-pay

## 2016-06-26 IMAGING — US US OB COMP LESS 14 WK
1 series · 15 of 28 positions shown · non-contrast
Comparison: None.

CLINICAL DATA: Pelvic pain

EXAM:
OBSTETRIC <14 WK US AND TRANSVAGINAL OB US
TECHNIQUE: Both transabdominal and transvaginal ultrasound examinations were
performed for complete evaluation of the gestation as well as the
maternal uterus, adnexal regions, and pelvic cul-de-sac.
Transvaginal technique was performed to assess early pregnancy.

[Series 1: us ob comp less 14 wk · 15 of 70 slices shown]
[im 1/70]
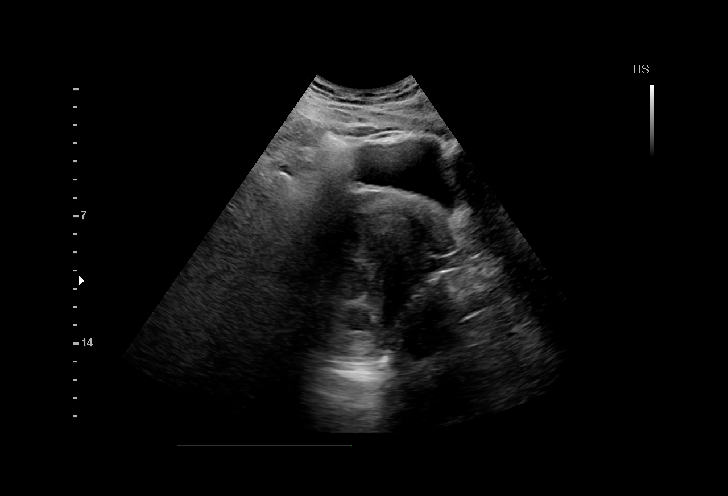
[im 6/70]
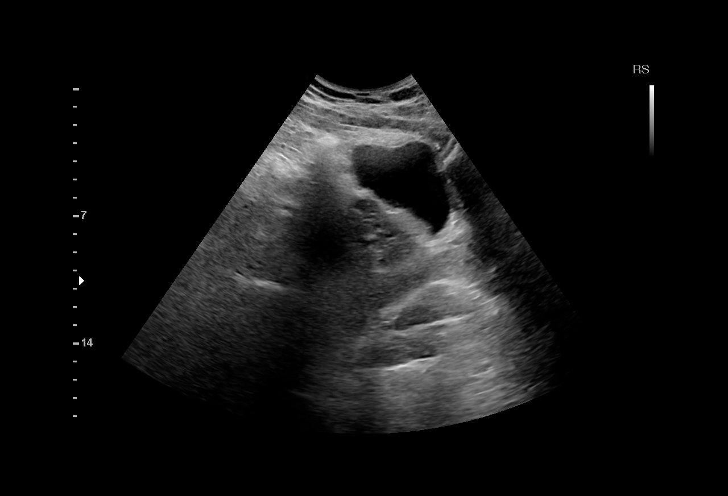
[im 11/70]
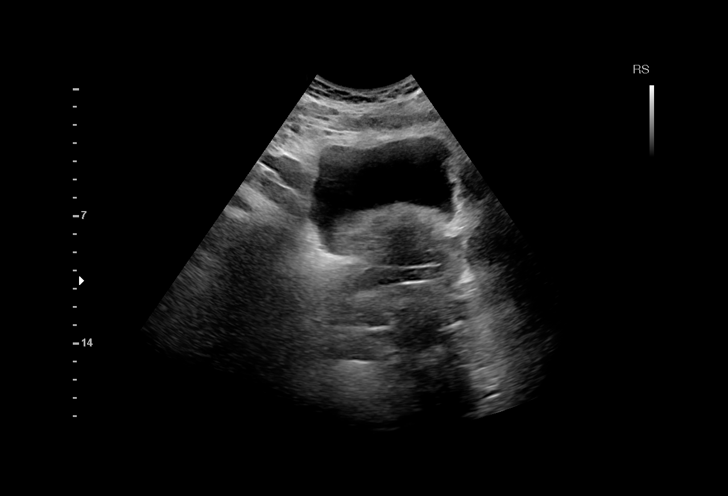
[im 16/70]
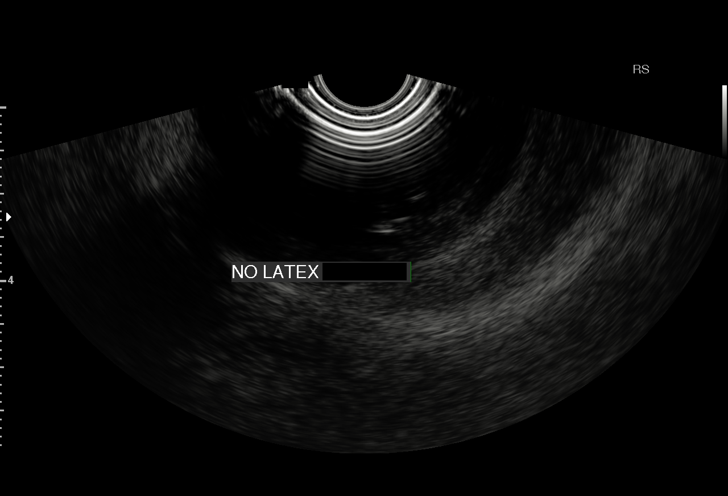
[im 21/70]
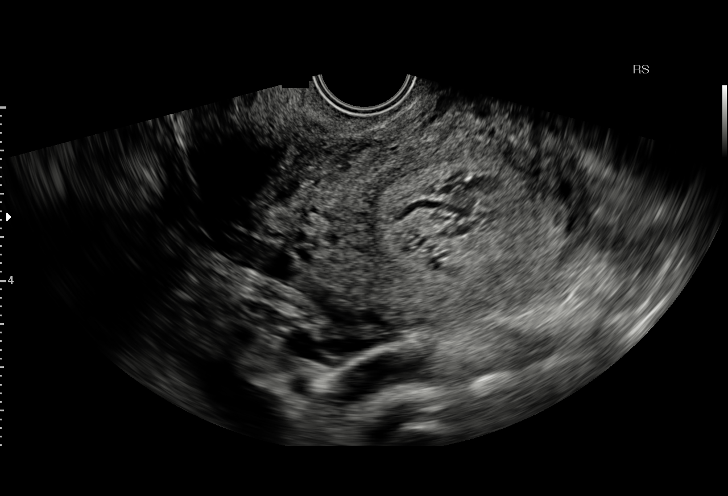
[im 26/70]
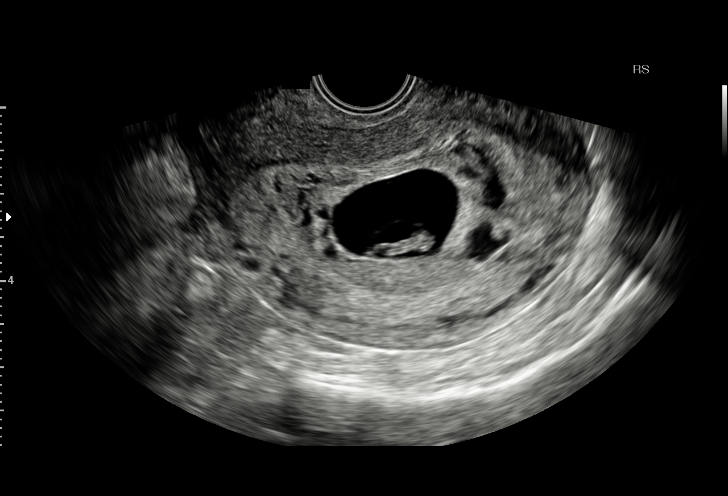
[im 31/70]
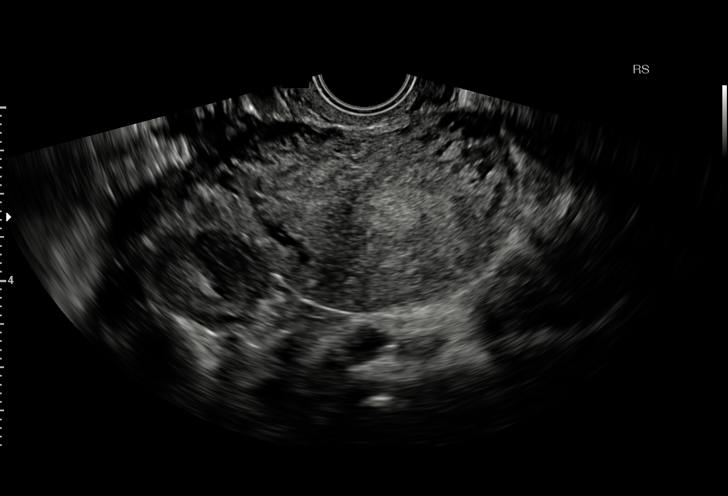
[im 36/70]
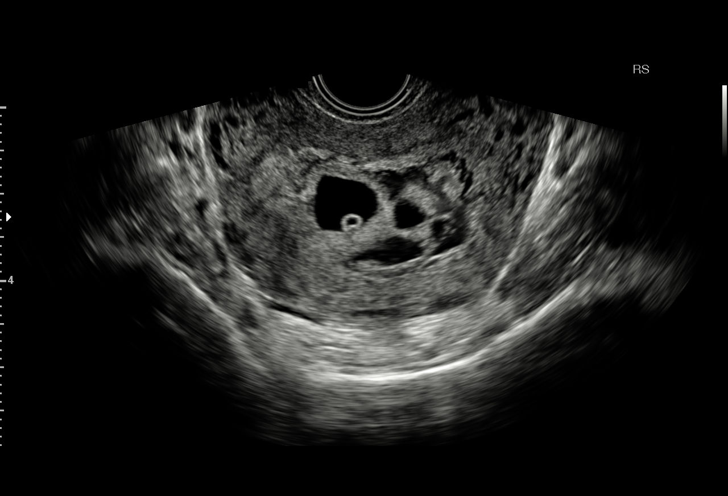
[im 39/70]
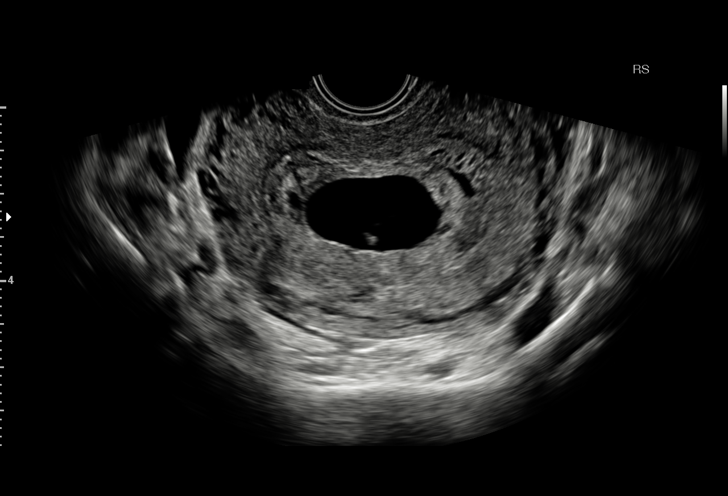
[im 44/70]
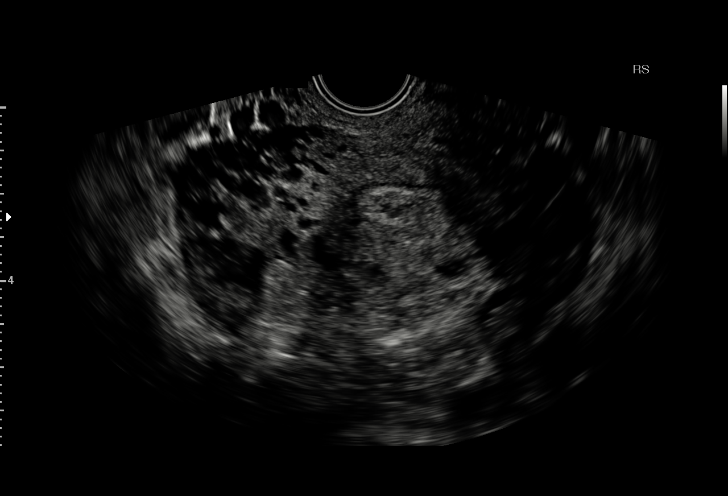
[im 49/70]
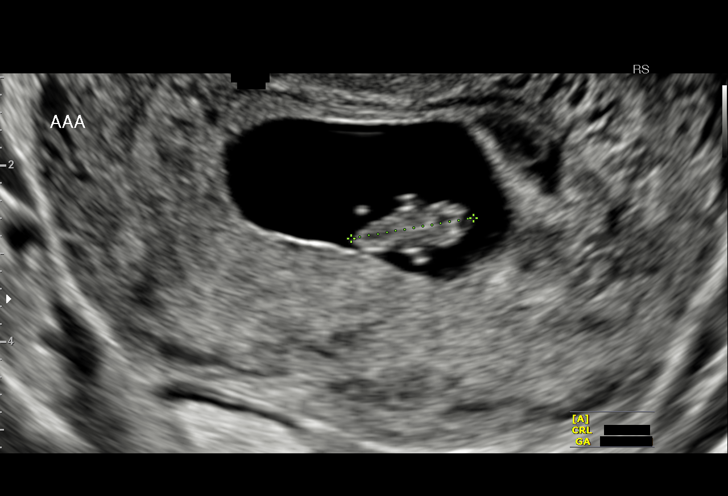
[im 54/70]
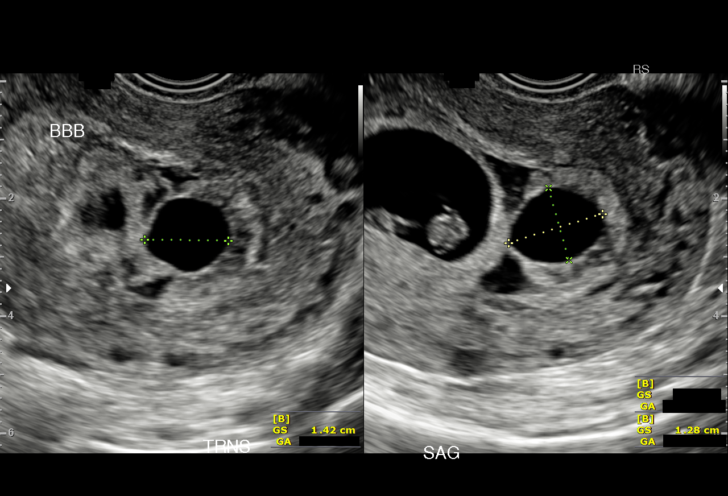
[im 59/70]
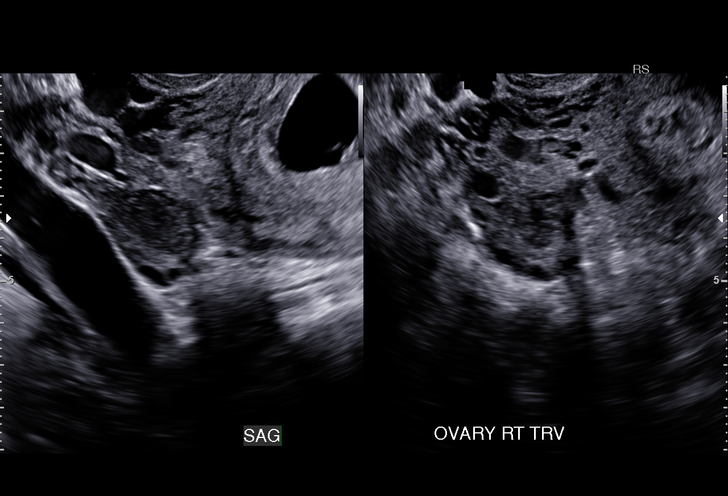
[im 64/70]
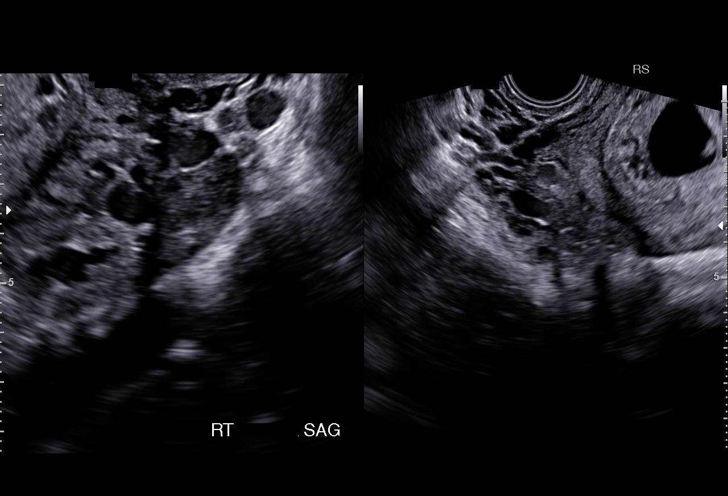
[im 70/70]
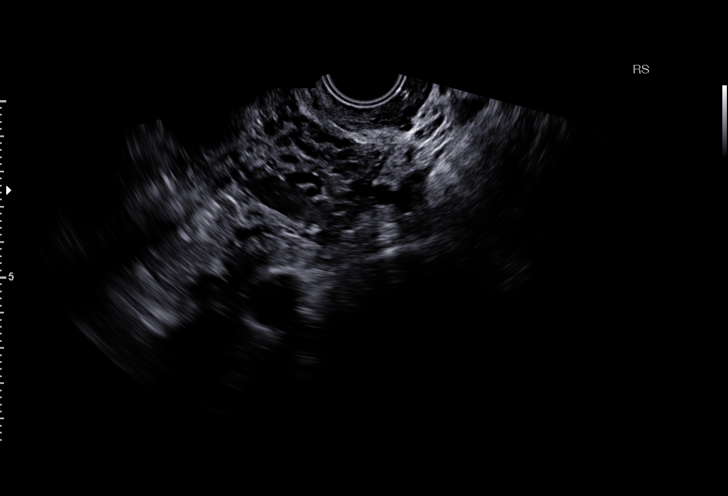

[15 of 28 positions shown; findings below may reference images not displayed]

FINDINGS: There are 2 intrauterine sacs. One is a viable intrauterine
gestation with visible yolk sac and embryo. The other is an empty
sac. The empty sac measures 14.6 mm in mean diameter which would be
consistent with a gestational age of 6 weeks 2 days. The viable
gestational sac measures 26.1 mm and the visible embryo has a
crown-rump length of 14.1 mm, consistent with a gestational age of 7
weeks 5 days. Heart rate is 150 beats per min. US EDC = 06/25/15.

Maternal uterus/adnexae: Unremarkable ovaries. Small-moderate
subchorionic hemorrhage measuring 3.0 x 2.7 x 1.8 cm.
IMPRESSION: 1. There is a viable intrauterine gestational sac measuring 7 weeks
5 days by crown-rump length.
2. There is a second intrauterine sac which is empty, with mean sac
diameter consistent with 6 week 2 day gestation. No yolk sac or
fetal parts are visible in this second sac.
3. Small to moderate subchorionic hemorrhage.

## 2016-07-23 IMAGING — US US OB COMP LESS 14 WK
1 series · 15 of 28 positions shown · non-contrast
Comparison: 11/11/2014

CLINICAL DATA: Pelvic pressure.  Followup subchorionic hemorrhage.

EXAM:
OBSTETRIC <14 WK ULTRASOUND
TECHNIQUE: Transabdominal ultrasound was performed for evaluation of the
gestation as well as the maternal uterus and adnexal regions.

[Series 1: us ob comp less 14 wk · 39 acquisitions, 15 frames shown]
[im 1/39]
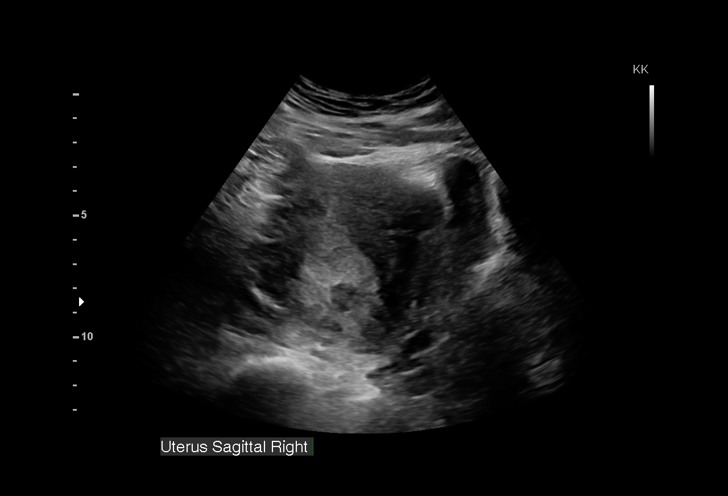
[im 3/39]
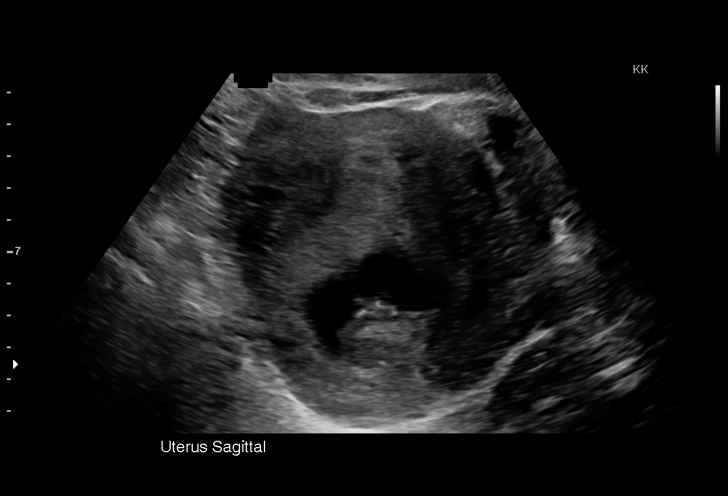
[im 6/39]
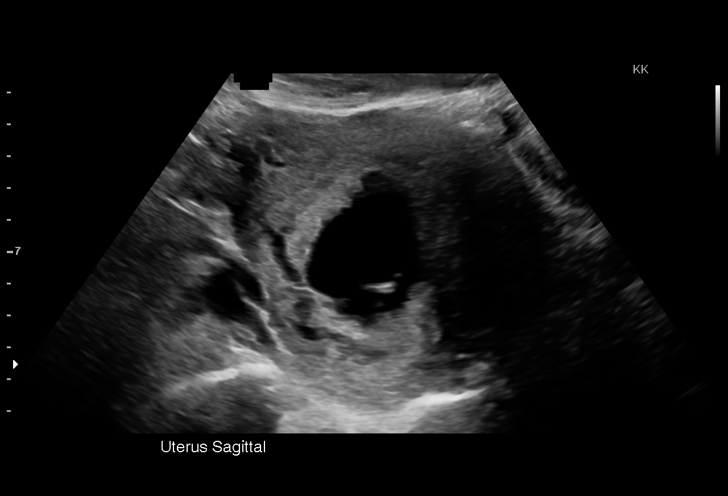
[im 9/39]
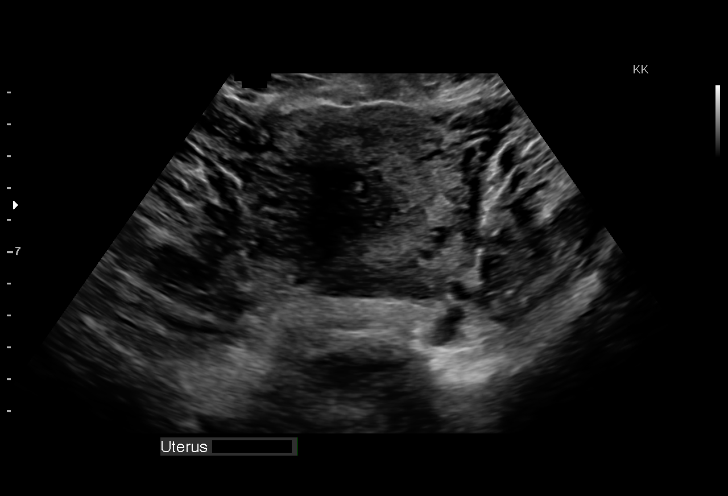
[im 12/39]
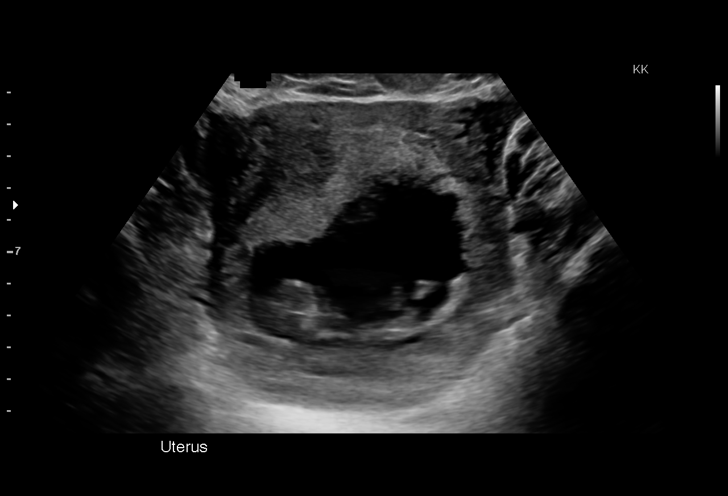
[im 15/39]
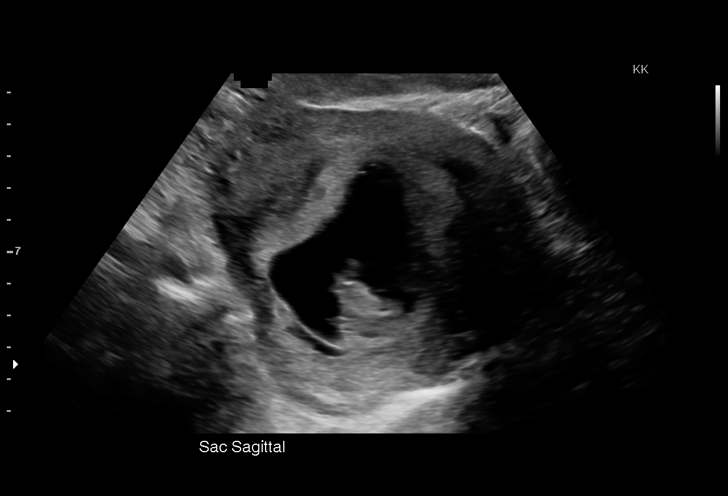
[im 17/39]
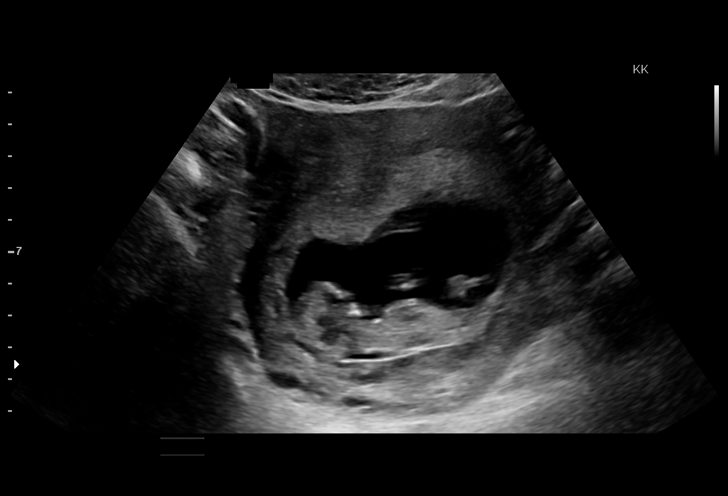
[im 20/39]
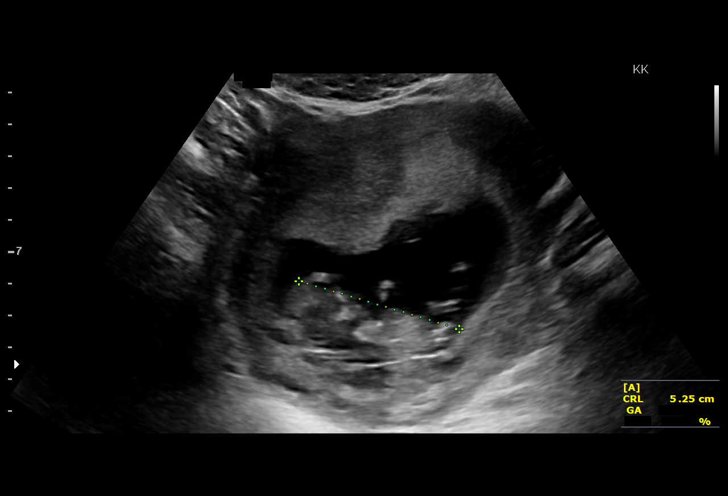
[im 22/39]
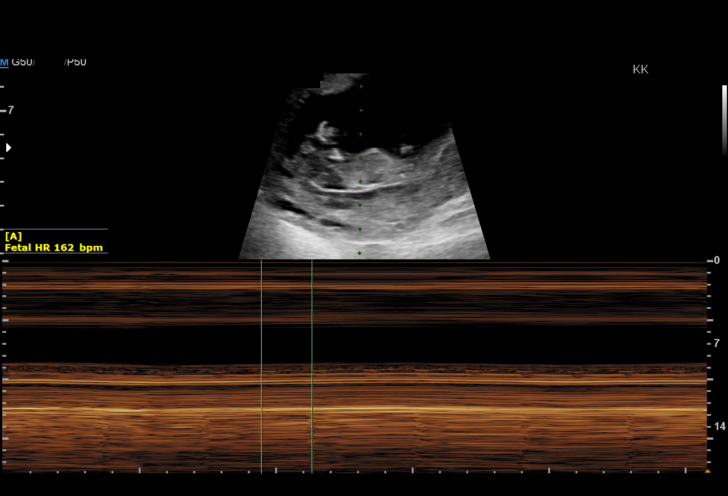
[im 24/39]
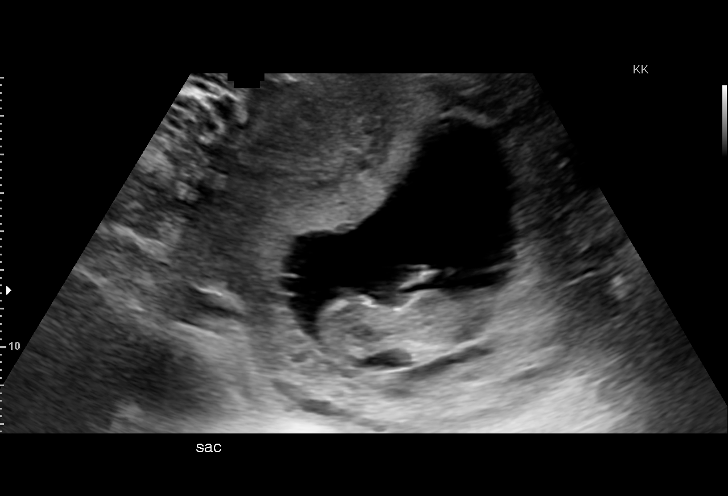
[im 27/39]
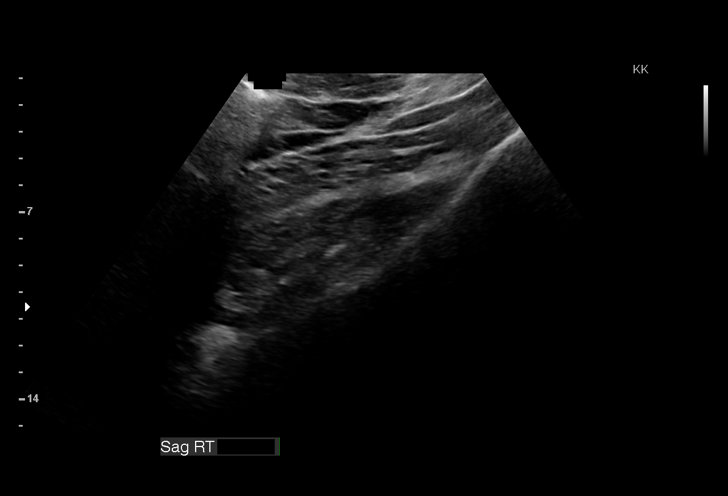
[im 30/39]
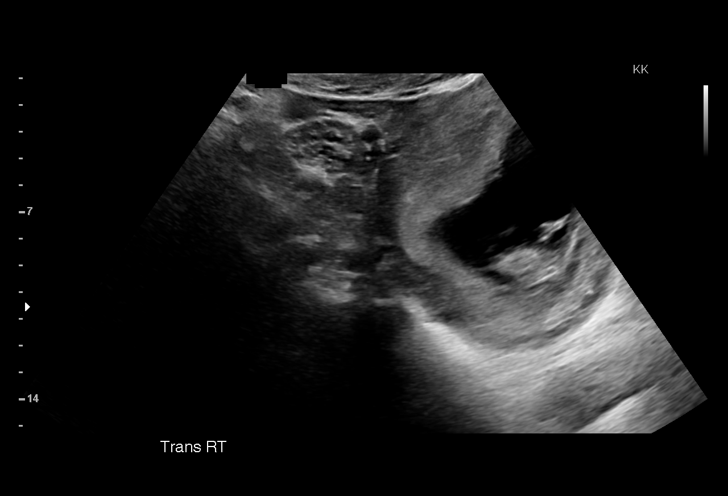
[im 33/39]
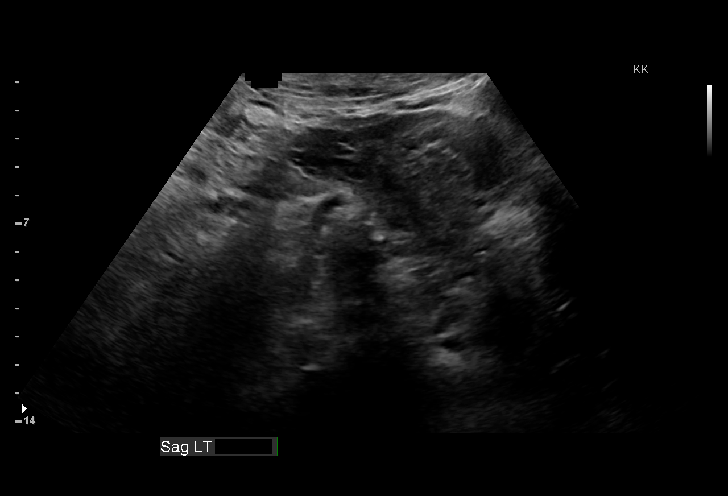
[im 36/39]
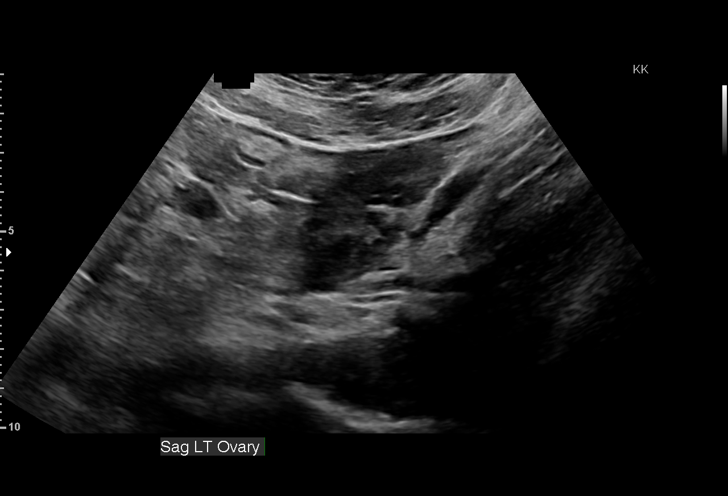
[im 39/39]
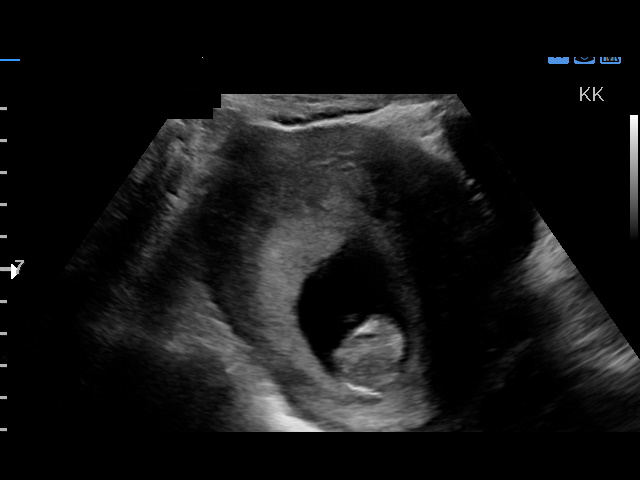

[15 of 28 positions shown; findings below may reference images not displayed]

FINDINGS: Intrauterine gestational sac: Single

Yolk sac:  Yes

Embryo:  Yes

Cardiac Activity: Yes

Heart Rate: 162 bpm

MSD:   mm    w     d

CRL:   53.1  mm   12 w 0 d                  US EDC: 06/22/2015

Maternal uterus/adnexae:

Subchorionic hemorrhage: Small subchorionic hemorrhage. Decreased
from previous exam

Right ovary: Normal

Left ovary: Normal

Other :Previously noted second gestational sac is no longer
visualized.

Free fluid:  None
IMPRESSION: 1. Single living intrauterine gestation with an estimated date age
of 12 weeks and 0 days.
2. Small subchorionic hemorrhage decreased from previous exam.
3. Resolution of previously suspected second  gestational sac.

## 2016-09-21 IMAGING — US US MFM OB COMP +14 WKS
1 series · 14 of 28 positions shown · non-contrast
Comparison: none

[Series 1: us mfm ob comp +14 wks · 14 of 53 slices shown]
[im 2/53]
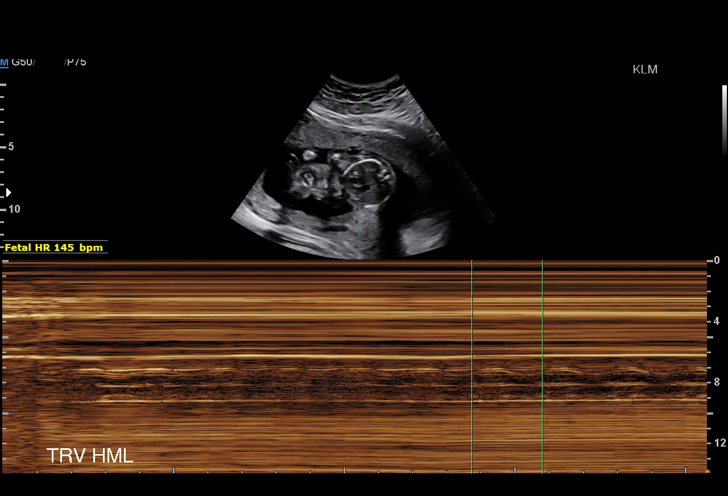
[im 6/53]
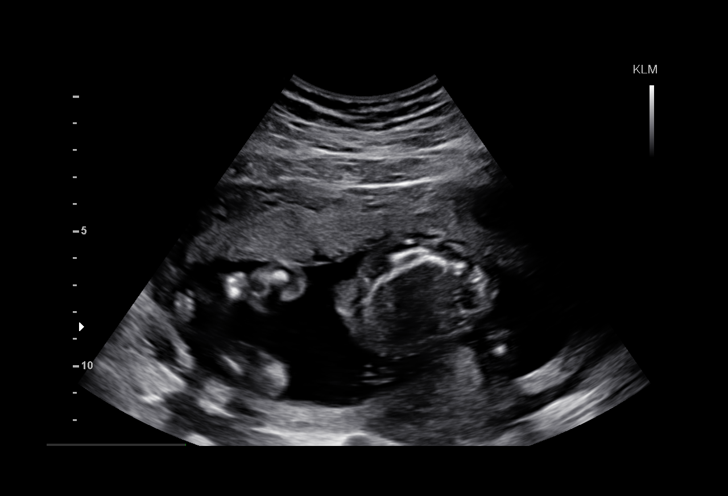
[im 10/53]
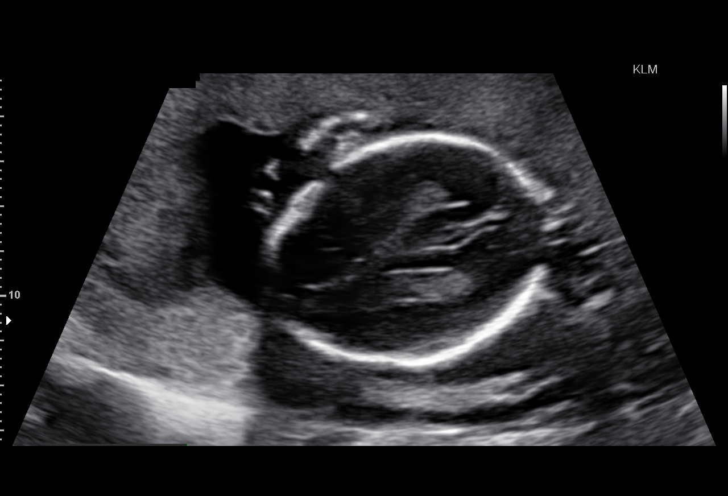
[im 14/53]
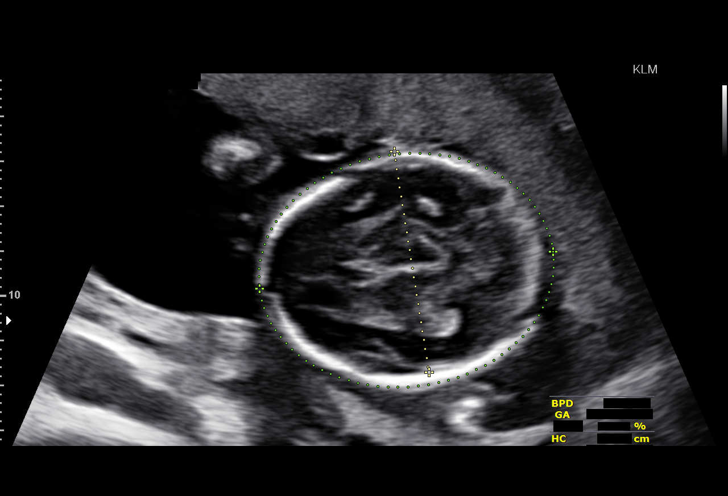
[im 18/53]
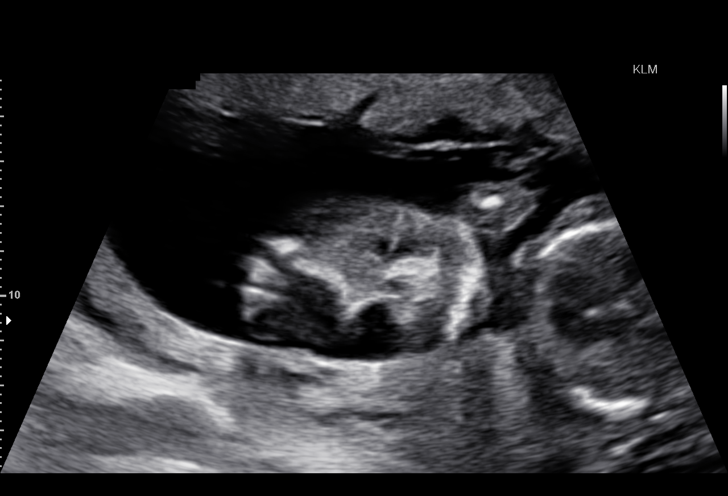
[im 22/53]
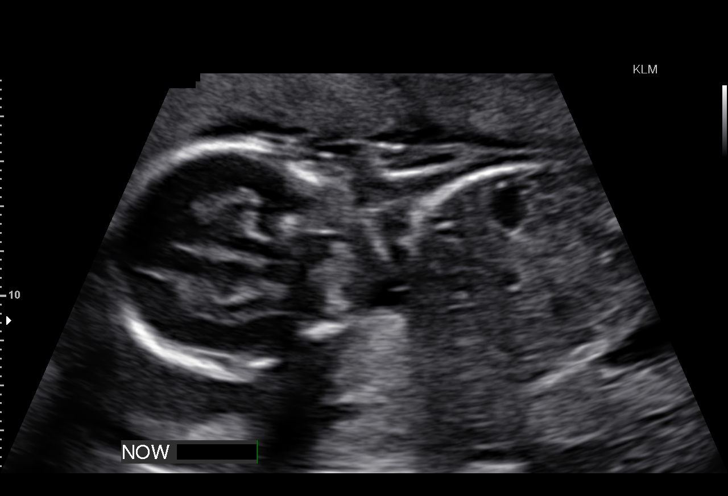
[im 26/53]
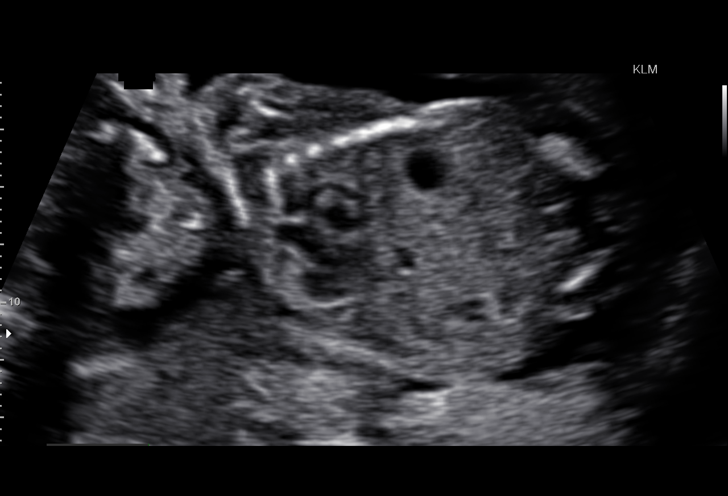
[im 29/53]
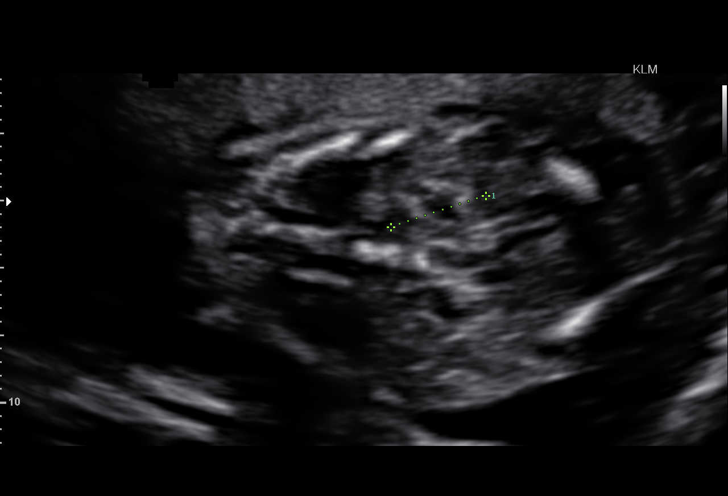
[im 33/53]
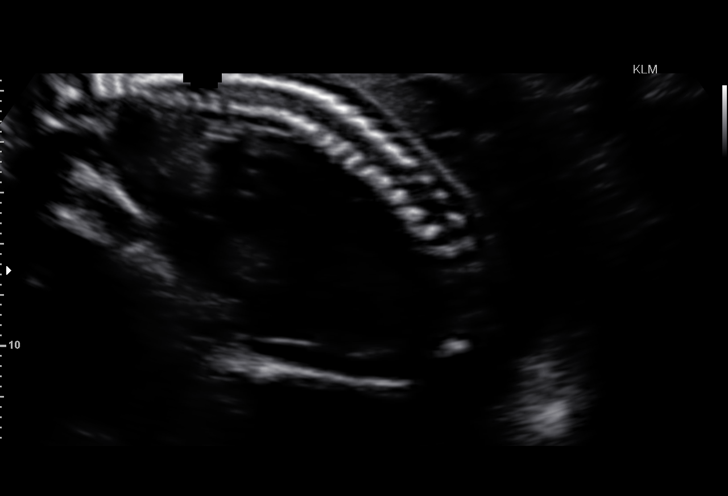
[im 37/53]
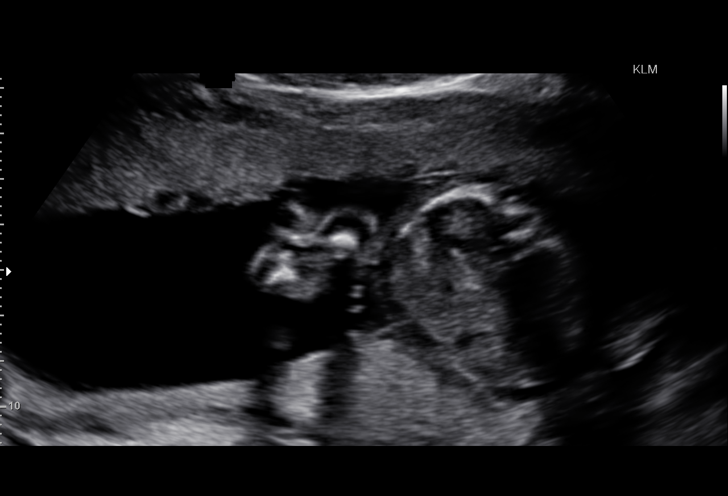
[im 41/53]
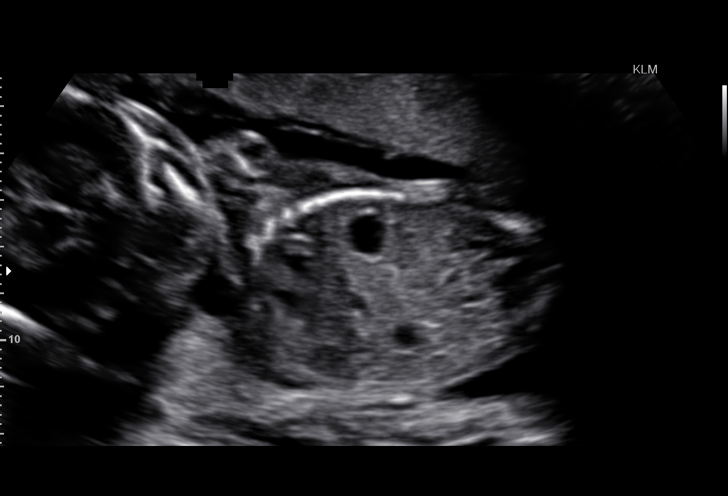
[im 45/53]
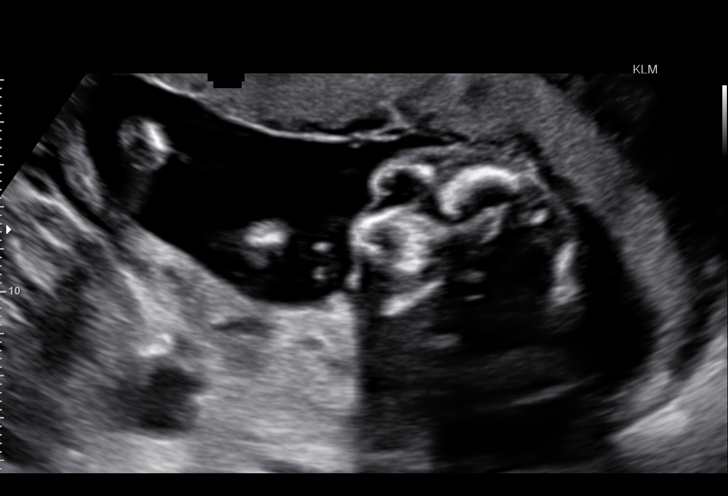
[im 49/53]
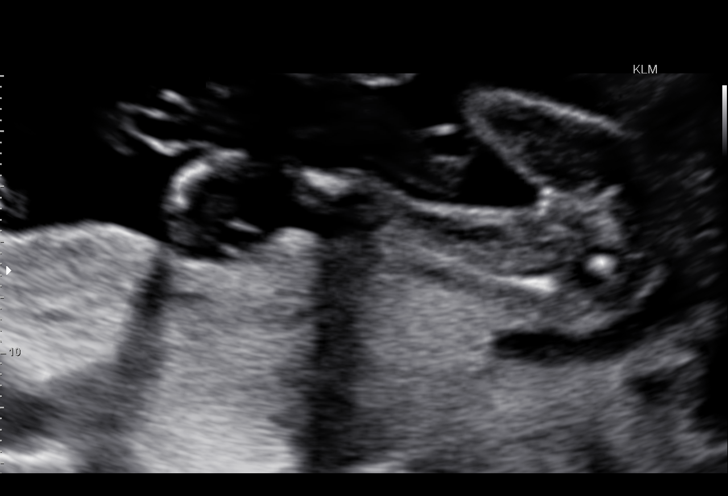
[im 53/53]
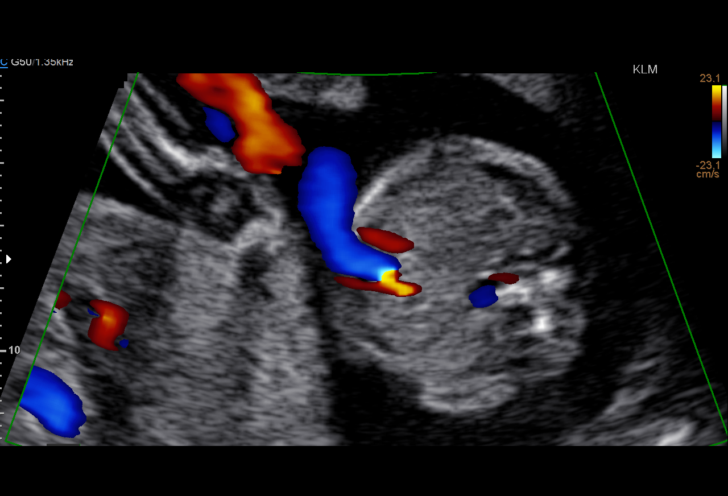

[14 of 28 positions shown; findings below may reference images not displayed]

Indications

Medical complication of pregnancy (elevated
HgB F)
Previous cesarean delivery, antepartum
Basic anatomic survey                          Z36
20 weeks gestation of pregnancy
OB History

Gravidity:    3         Term:   1        Prem:   1        SAB:   0
TOP:          0       Ectopic:  0        Living: 3
Fetal Evaluation

Num Of Fetuses:     1
Fetal Heart         145
Rate(bpm):
Cardiac Activity:   Observed
Presentation:       Variable
Placenta:           Anterior, above cervical os
P. Cord Insertion:  Visualized

Amniotic Fluid
AFI FV:      Subjectively within normal limits
Larg Pckt:     4.6  cm
Biometry

BPD:      49.3  mm     G. Age:  20w 6d                  CI:        75.66   %    70 - 86
FL/HC:      19.2   %    16.8 -
HC:      179.7  mm     G. Age:  20w 3d         61  %    HC/AC:      1.15        1.09 -
AC:      155.9  mm     G. Age:  20w 6d         69  %    FL/BPD:     70.0   %
FL:       34.5  mm     G. Age:  20w 6d         73  %    FL/AC:      22.1   %    20 - 24
Est. FW:     376  gm    0 lb 13 oz      59  %
Gestational Age

LMP:           20w 0d        Date:  09/19/14                 EDD:   06/26/15
U/S Today:     20w 5d                                        EDD:   06/21/15
Best:          20w 0d     Det. By:  LMP  (09/19/14)          EDD:   06/26/15
Anatomy

Cranium:          Appears normal         Aortic Arch:      Not well visualized
Fetal Cavum:      Appears normal         Ductal Arch:      Not well visualized
Ventricles:       Appears normal         Diaphragm:        Appears normal
Choroid Plexus:   Appears normal         Stomach:          Appears normal, left
sided
Cerebellum:       Appears normal         Abdomen:          Appears normal
Posterior Fossa:  Appears normal         Abdominal Wall:   Not well visualized
Nuchal Fold:      Not applicable (>20    Cord Vessels:     Appears normal (3
wks GA)                                  vessel cord)
Face:             Orbits nl; profile not Kidneys:          Appear normal
well visualized
Lips:             Appears normal         Bladder:          Appears normal
Fetal Thoracic:   Appears normal         Spine:            Appears normal
Heart:            Not well visualized    Upper             Appears normal
Extremities:
RVOT:             Not well visualized    Lower             Appears normal
Extremities:
LVOT:             Not well visualized

Other:  Fetus appears to be a female.
Cervix Uterus Adnexa

Cervix
Length:            4.9  cm.
Normal appearance by transabdominal scan.
Impression

Single IUP at 20w 0d
Limited views of the fetal heart obtained
The remainder of the fetal anatomy appears normal
Ultrasound measurements are consistent with dates
Normal amniotic fluid volume
Recommendations

Recommend follow-up ultrasound examination in 4 weeks to
reevalute the fetal heart.
